# Patient Record
Sex: Female | Born: 1980 | Race: Black or African American | Marital: Married | Smoking: Never smoker
Health system: Northeastern US, Academic
[De-identification: ages and names within clinical notes are randomized; demographics above are authoritative.]

## PROBLEM LIST (undated history)

## (undated) ENCOUNTER — Inpatient Hospital Stay (HOSPITAL_COMMUNITY): Payer: Self-pay

## (undated) DIAGNOSIS — J45909 Unspecified asthma, uncomplicated: Secondary | ICD-10-CM

## (undated) DIAGNOSIS — E669 Obesity, unspecified: Secondary | ICD-10-CM

## (undated) DIAGNOSIS — Z8632 Personal history of gestational diabetes: Secondary | ICD-10-CM

## (undated) HISTORY — DX: Unspecified asthma, uncomplicated: J45.909

## (undated) HISTORY — DX: Obesity, unspecified: E66.9

## (undated) HISTORY — PX: HAND SURGERY: SHX662

## (undated) HISTORY — DX: Personal history of gestational diabetes: Z86.32

---

## 2012-10-14 LAB — CYTOLOGY - PAP: Pap Smear: NEGATIVE

## 2013-09-20 LAB — OB RESULTS CONSOLE HIV ANTIBODY (ROUTINE TESTING): HIV: NONREACTIVE

## 2013-09-20 LAB — OB RESULTS CONSOLE VARICELLA ZOSTER ANTIBODY, IGG: VARICELLA IGG: IMMUNE

## 2013-09-20 LAB — OB RESULTS CONSOLE HGB/HCT, BLOOD
HCT: 33 %
Hemoglobin: 10.1 g/dL

## 2013-09-20 LAB — OB RESULTS CONSOLE RPR: RPR: NONREACTIVE

## 2013-09-20 LAB — OB RESULTS CONSOLE RUBELLA ANTIBODY, IGM: RUBELLA: IMMUNE

## 2013-09-20 LAB — OB RESULTS CONSOLE HEPATITIS B SURFACE ANTIGEN: Hepatitis B Surface Ag: NEGATIVE

## 2013-09-20 LAB — OB RESULTS CONSOLE ABO/RH: RH Type: POSITIVE

## 2013-09-20 LAB — OB RESULTS CONSOLE PLATELET COUNT: PLATELETS: 137 10*3/uL

## 2013-09-20 LAB — GLUCOSE TOLERANCE, 1 HOUR (50G) W/O FASTING: Glucose, 1 Hour GTT: 158

## 2013-09-20 LAB — SICKLE CELL SCREEN: Sickle Cell Screen: NORMAL

## 2013-09-20 LAB — OB RESULTS CONSOLE ANTIBODY SCREEN: ANTIBODY SCREEN: NEGATIVE

## 2013-09-21 ENCOUNTER — Other Ambulatory Visit (HOSPITAL_COMMUNITY): Payer: Self-pay | Admitting: Obstetrics and Gynecology

## 2013-09-21 DIAGNOSIS — O09529 Supervision of elderly multigravida, unspecified trimester: Secondary | ICD-10-CM

## 2013-09-22 LAB — GLUCOSE TOLERANCE, 3 HOURS
GLUCOSE: 141
Glucose, 1 hour: 178
Glucose, Fasting: 109 mg/dL (ref 60–109)
Glucose: 168

## 2013-10-01 ENCOUNTER — Encounter (HOSPITAL_COMMUNITY): Payer: Self-pay

## 2013-10-01 ENCOUNTER — Ambulatory Visit (HOSPITAL_COMMUNITY): Admission: RE | Admit: 2013-10-01 | Payer: Medicaid Other | Source: Ambulatory Visit

## 2013-10-01 ENCOUNTER — Ambulatory Visit (HOSPITAL_COMMUNITY)
Admission: RE | Admit: 2013-10-01 | Discharge: 2013-10-01 | Disposition: A | Discharge status: Home / Self Care | Payer: Medicaid Other | Source: Ambulatory Visit | Attending: Family Medicine | Admitting: Family Medicine

## 2013-10-01 DIAGNOSIS — Z3689 Encounter for other specified antenatal screening: Secondary | ICD-10-CM | POA: Insufficient documentation

## 2013-10-01 DIAGNOSIS — O09529 Supervision of elderly multigravida, unspecified trimester: Secondary | ICD-10-CM | POA: Insufficient documentation

## 2013-10-04 ENCOUNTER — Encounter: Payer: Medicaid Other | Attending: Obstetrics and Gynecology | Admitting: *Deleted

## 2013-10-04 ENCOUNTER — Encounter: Payer: Self-pay | Admitting: Obstetrics and Gynecology

## 2013-10-04 ENCOUNTER — Ambulatory Visit (INDEPENDENT_AMBULATORY_CARE_PROVIDER_SITE_OTHER): Payer: Medicaid Other | Admitting: Obstetrics and Gynecology

## 2013-10-04 VITALS — BP 113/79 | HR 77 | Temp 98.6°F | Ht 64.0 in | Wt 190.8 lb

## 2013-10-04 DIAGNOSIS — O09299 Supervision of pregnancy with other poor reproductive or obstetric history, unspecified trimester: Secondary | ICD-10-CM | POA: Insufficient documentation

## 2013-10-04 DIAGNOSIS — Z713 Dietary counseling and surveillance: Secondary | ICD-10-CM | POA: Diagnosis not present

## 2013-10-04 DIAGNOSIS — O24410 Gestational diabetes mellitus in pregnancy, diet controlled: Secondary | ICD-10-CM

## 2013-10-04 DIAGNOSIS — O99810 Abnormal glucose complicating pregnancy: Secondary | ICD-10-CM | POA: Insufficient documentation

## 2013-10-04 DIAGNOSIS — IMO0002 Reserved for concepts with insufficient information to code with codable children: Secondary | ICD-10-CM

## 2013-10-04 DIAGNOSIS — O0990 Supervision of high risk pregnancy, unspecified, unspecified trimester: Secondary | ICD-10-CM | POA: Insufficient documentation

## 2013-10-04 DIAGNOSIS — O24419 Gestational diabetes mellitus in pregnancy, unspecified control: Secondary | ICD-10-CM | POA: Insufficient documentation

## 2013-10-04 DIAGNOSIS — O0992 Supervision of high risk pregnancy, unspecified, second trimester: Secondary | ICD-10-CM

## 2013-10-04 DIAGNOSIS — Z23 Encounter for immunization: Secondary | ICD-10-CM

## 2013-10-04 LAB — POCT URINALYSIS DIP (DEVICE)
BILIRUBIN URINE: NEGATIVE
Glucose, UA: NEGATIVE mg/dL
Hgb urine dipstick: NEGATIVE
KETONES UR: NEGATIVE mg/dL
NITRITE: NEGATIVE
PH: 5.5 (ref 5.0–8.0)
Protein, ur: NEGATIVE mg/dL
Specific Gravity, Urine: 1.01 (ref 1.005–1.030)
Urobilinogen, UA: 0.2 mg/dL (ref 0.0–1.0)

## 2013-10-04 MED ORDER — GLUCOSE BLOOD VI STRP: ORAL_STRIP | Status: AC

## 2013-10-04 MED ORDER — ACCU-CHEK FASTCLIX LANCETS MISC: 1.0000 | Freq: Four times a day (QID) | Status: AC

## 2013-10-04 NOTE — Progress Notes (Signed)
Initial prenatal visit, Education officer, community # [id number],

## 2013-10-04 NOTE — Progress Notes (Signed)
Pt was seen today for GDM diet education.

## 2013-10-04 NOTE — Progress Notes (Signed)
Pt here due to gestational diabetes. Has not yet received education re: this. Fasting 109 1 hr 178 2 hr 168 3 hr 141 Has had prenatal labs done at [facility], including normal pap (scanned in) Had PN [place] - it's a boy! EDD 01/19/2014 Does have a history of still birth, full term (9 months, delivered at 10 months)  She is worried she will not make it to hospital in time for delivery because she lives in [place] and has no transportation. Will meet with diabetic nurse for teaching and RTC 2 wks with blood sugars Flu today.

## 2013-10-04 NOTE — Progress Notes (Signed)
Pt obese pregravid with wt gain slightly below recommendations today.  No N/V reported.   Pt reports eating 3 meals/day.  Pt taking PNV.  No meds.  Pt given written and verbal education on GDM diet.  Discussed wt gain goal of 11-20# and incorporating physical activity into daily routine.  Pt agrees to follow GDM diet including 3 meals + 3 snacks and proper carbohydrate/protein combination.  Pt already receives [facility].  Pt will be seen for follow-up if referred.  Melanee Left, MPH RD LDN

## 2013-10-04 NOTE — Addendum Note (Signed)
Addended by: Rosendo Gros on: 10/04/2013 09:49 AM   Modules accepted: Orders

## 2013-10-04 NOTE — Progress Notes (Signed)
  Patient was seen on 10/04/13 for Gestational Diabetes self-management . The following learning objectives were met by the patient :   States when to check blood glucose levels  Demonstrates proper blood glucose monitoring techniques  States the effect of stress and exercise on blood glucose levels  States the importance of limiting caffeine and abstaining from alcohol and smoking  Plan:  Consider  increasing your activity level by walking daily as tolerated Begin checking BG before breakfast and 2 hours after first bit of breakfast, lunch and dinner after  as directed by MD   Blood glucose monitor given: Accu Chek Nano BG Monitoring Kit Lot # [id number]  Exp: 08/20/13 Blood glucose reading: $RemoveBeforeDE'106mg'McjMngnHnDGcPmC$ /dl  Patient instructed to monitor glucose levels: FBS: 60 - <90 1 hour: <140 2 hour: <120  Patient received the following handouts:  Nutrition Diabetes and Pregnancy  Carbohydrate Counting List  Meal Planning worksheet  Patient will be seen for follow-up as needed.

## 2013-10-18 ENCOUNTER — Ambulatory Visit (INDEPENDENT_AMBULATORY_CARE_PROVIDER_SITE_OTHER): Payer: Medicaid Other | Admitting: Obstetrics & Gynecology

## 2013-10-18 VITALS — BP 98/75 | HR 79 | Temp 97.9°F | Wt 189.8 lb

## 2013-10-18 DIAGNOSIS — O0942 Supervision of pregnancy with grand multiparity, second trimester: Secondary | ICD-10-CM

## 2013-10-18 DIAGNOSIS — O99810 Abnormal glucose complicating pregnancy: Secondary | ICD-10-CM

## 2013-10-18 DIAGNOSIS — O09292 Supervision of pregnancy with other poor reproductive or obstetric history, second trimester: Secondary | ICD-10-CM

## 2013-10-18 DIAGNOSIS — O24410 Gestational diabetes mellitus in pregnancy, diet controlled: Secondary | ICD-10-CM

## 2013-10-18 DIAGNOSIS — O0940 Supervision of pregnancy with grand multiparity, unspecified trimester: Secondary | ICD-10-CM | POA: Insufficient documentation

## 2013-10-18 DIAGNOSIS — O09299 Supervision of pregnancy with other poor reproductive or obstetric history, unspecified trimester: Secondary | ICD-10-CM

## 2013-10-18 LAB — POCT URINALYSIS DIP (DEVICE)
Bilirubin Urine: NEGATIVE
GLUCOSE, UA: NEGATIVE mg/dL
Ketones, ur: NEGATIVE mg/dL
NITRITE: NEGATIVE
Protein, ur: NEGATIVE mg/dL
Specific Gravity, Urine: 1.015 (ref 1.005–1.030)
UROBILINOGEN UA: 0.2 mg/dL (ref 0.0–1.0)
pH: 7 (ref 5.0–8.0)

## 2013-10-18 MED ORDER — PRENATAL VITAMINS PLUS 27-1 MG PO TABS: 1.0000 | ORAL_TABLET | Freq: Every day | ORAL | Status: DC

## 2013-10-18 MED ORDER — TETANUS-DIPHTH-ACELL PERTUSSIS 5-2.5-18.5 LF-MCG/0.5 IM SUSP: 0.5000 mL | Freq: Once | INTRAMUSCULAR | Status: DC

## 2013-10-18 NOTE — Progress Notes (Signed)
[employer] interpreter [phone] used during this encounter.

## 2013-10-18 NOTE — Progress Notes (Signed)
[employer] interpreter [phone] ([place]i) used for this encounter On review of BS, she has BS on 9/14 to 9/20.  Patient claims these dates were prewritten for her when the book was given to her; but the values correspond to last week. She is also checking her BS five times a day.  She reports checking pre and post breakfast, post lunch, post dinner, and bedtime. Fasting values are within range, have a few elevated postprandials after lunch (3) and dinner (3).  Adherence to diet recommended.   Elevated fundal height noted today.  If persistent by next visit, consider ultrasound Declines tubal sterilization.  Tdap, third trimester labs today. No other complaints or concerns.  Labor and fetal movement precautions reviewed.

## 2013-10-18 NOTE — Patient Instructions (Signed)
Return to clinic for any obstetric concerns or go to MAU for evaluation  

## 2013-10-19 LAB — CBC
HEMATOCRIT: 35 % — AB (ref 36.0–46.0)
Hemoglobin: 11.4 g/dL — ABNORMAL LOW (ref 12.0–15.0)
MCH: 26.6 pg (ref 26.0–34.0)
MCHC: 32.6 g/dL (ref 30.0–36.0)
MCV: 81.6 fL (ref 78.0–100.0)
PLATELETS: 146 10*3/uL — AB (ref 150–400)
RBC: 4.29 MIL/uL (ref 3.87–5.11)
RDW: 23.5 % — AB (ref 11.5–15.5)
WBC: 6.4 10*3/uL (ref 4.0–10.5)

## 2013-10-19 LAB — HEMOGLOBIN A1C
HEMOGLOBIN A1C: 5.7 % — AB (ref <5.7)
Mean Plasma Glucose: 117 mg/dL — ABNORMAL HIGH (ref <117)

## 2013-10-19 LAB — RPR

## 2013-10-19 LAB — HIV ANTIBODY (ROUTINE TESTING W REFLEX): HIV 1&2 Ab, 4th Generation: NONREACTIVE

## 2013-11-01 ENCOUNTER — Encounter: Payer: Self-pay | Admitting: Obstetrics & Gynecology

## 2013-11-01 ENCOUNTER — Encounter: Payer: Medicaid Other | Admitting: Obstetrics & Gynecology

## 2013-11-08 ENCOUNTER — Ambulatory Visit (INDEPENDENT_AMBULATORY_CARE_PROVIDER_SITE_OTHER): Payer: Medicaid Other | Admitting: Obstetrics and Gynecology

## 2013-11-08 VITALS — BP 112/74 | HR 76 | Wt 193.6 lb

## 2013-11-08 DIAGNOSIS — O24410 Gestational diabetes mellitus in pregnancy, diet controlled: Secondary | ICD-10-CM

## 2013-11-08 DIAGNOSIS — O09292 Supervision of pregnancy with other poor reproductive or obstetric history, second trimester: Secondary | ICD-10-CM

## 2013-11-08 DIAGNOSIS — O0993 Supervision of high risk pregnancy, unspecified, third trimester: Secondary | ICD-10-CM

## 2013-11-08 LAB — POCT URINALYSIS DIP (DEVICE)
Bilirubin Urine: NEGATIVE
Glucose, UA: NEGATIVE mg/dL
Ketones, ur: NEGATIVE mg/dL
Nitrite: NEGATIVE
PROTEIN: NEGATIVE mg/dL
Specific Gravity, Urine: 1.01 (ref 1.005–1.030)
UROBILINOGEN UA: 0.2 mg/dL (ref 0.0–1.0)
pH: 6 (ref 5.0–8.0)

## 2013-11-08 NOTE — Progress Notes (Signed)
Language line used today. Doing well today.  1. A1GDM. Well controlled on diet alone. Reviewed blood sugars. Continue diet and exercise.  2. History of IUFD at term. Weekly BPP starting at 28 weeks. Has not had any BPPs yet. Schedule for later this week.

## 2013-11-08 NOTE — Progress Notes (Signed)
BPP with Radiology 11/11/13 @ 315p.

## 2013-11-11 ENCOUNTER — Encounter (HOSPITAL_COMMUNITY): Payer: Self-pay | Admitting: *Deleted

## 2013-11-11 ENCOUNTER — Other Ambulatory Visit: Payer: Self-pay | Admitting: Obstetrics and Gynecology

## 2013-11-11 ENCOUNTER — Ambulatory Visit (HOSPITAL_COMMUNITY)
Admission: RE | Admit: 2013-11-11 | Discharge: 2013-11-11 | Disposition: A | Discharge status: Home / Self Care | Payer: Medicaid Other | Source: Ambulatory Visit | Attending: Obstetrics and Gynecology | Admitting: Obstetrics and Gynecology

## 2013-11-11 ENCOUNTER — Inpatient Hospital Stay (HOSPITAL_COMMUNITY)
Admission: AD | Admit: 2013-11-11 | Discharge: 2013-11-11 | Disposition: A | Discharge status: Home / Self Care | Payer: Medicaid Other | Source: Ambulatory Visit | Attending: Obstetrics & Gynecology | Admitting: Obstetrics & Gynecology

## 2013-11-11 DIAGNOSIS — Z3A30 30 weeks gestation of pregnancy: Secondary | ICD-10-CM | POA: Diagnosis not present

## 2013-11-11 DIAGNOSIS — O24414 Gestational diabetes mellitus in pregnancy, insulin controlled: Secondary | ICD-10-CM | POA: Insufficient documentation

## 2013-11-11 DIAGNOSIS — O403XX0 Polyhydramnios, third trimester, not applicable or unspecified: Secondary | ICD-10-CM | POA: Insufficient documentation

## 2013-11-11 DIAGNOSIS — O283 Abnormal ultrasonic finding on antenatal screening of mother: Secondary | ICD-10-CM | POA: Insufficient documentation

## 2013-11-11 DIAGNOSIS — Z36 Encounter for antenatal screening of mother: Secondary | ICD-10-CM

## 2013-11-11 DIAGNOSIS — O09293 Supervision of pregnancy with other poor reproductive or obstetric history, third trimester: Secondary | ICD-10-CM | POA: Insufficient documentation

## 2013-11-11 DIAGNOSIS — Z3689 Encounter for other specified antenatal screening: Secondary | ICD-10-CM

## 2013-11-11 DIAGNOSIS — O09292 Supervision of pregnancy with other poor reproductive or obstetric history, second trimester: Secondary | ICD-10-CM

## 2013-11-11 DIAGNOSIS — Z3A00 Weeks of gestation of pregnancy not specified: Secondary | ICD-10-CM | POA: Insufficient documentation

## 2013-11-11 DIAGNOSIS — O403XX1 Polyhydramnios, third trimester, fetus 1: Secondary | ICD-10-CM

## 2013-11-11 NOTE — MAU Note (Signed)
Patient was seen in ultrasound today and had a 6/8 BPP. Sent to MAU for monitoring. Patient denies bleeding, leaking or pain and reports fetal movement.

## 2013-11-11 NOTE — OB Triage Note (Signed)
Subjective: Tiffany Poole is a 33 y.o. [id number] at 1738w1d by ultrasound presenting with BPP 6/8 for NST. She had a term IUFD and is undergoing testing, as well as polyhydramnios.   Objective: BP 114/74  Pulse 78  Temp(Src) 98.3 F (36.8 C) (Oral)  Resp 16  LMP 05/05/2013    FHT:  FHR: 135 bpm, variability: moderate,  accelerations:  Present,  decelerations:  Absent            Reactive NST with 15 x 15 accelerations. Labs: Lab Results  Component Value Date   WBC 6.4 10/18/2013   HGB 11.4* 10/18/2013   HCT 35.0* 10/18/2013   MCV 81.6 10/18/2013   PLT 146* 10/18/2013    Assessment / Plan: Discharge to home   ADAMS,SHNIQUAL SHWON 11/11/2013, 7:55 PM  \I have seen and examined this patient and agree the above assessment. CRESENZO-DISHMAN,Coston Mandato 11/11/2013 8:20 PM

## 2013-11-11 NOTE — Discharge Instructions (Signed)

## 2013-11-15 ENCOUNTER — Encounter: Payer: Self-pay | Admitting: *Deleted

## 2013-11-22 ENCOUNTER — Encounter: Payer: Medicaid Other | Admitting: Obstetrics & Gynecology

## 2013-11-23 ENCOUNTER — Encounter (HOSPITAL_COMMUNITY): Payer: Self-pay | Admitting: *Deleted

## 2013-11-29 ENCOUNTER — Ambulatory Visit (INDEPENDENT_AMBULATORY_CARE_PROVIDER_SITE_OTHER): Payer: Medicaid Other | Admitting: Obstetrics & Gynecology

## 2013-11-29 VITALS — BP 108/52 | HR 82 | Temp 98.3°F | Wt 195.8 lb

## 2013-11-29 DIAGNOSIS — O99613 Diseases of the digestive system complicating pregnancy, third trimester: Secondary | ICD-10-CM

## 2013-11-29 DIAGNOSIS — K219 Gastro-esophageal reflux disease without esophagitis: Secondary | ICD-10-CM

## 2013-11-29 DIAGNOSIS — O24410 Gestational diabetes mellitus in pregnancy, diet controlled: Secondary | ICD-10-CM

## 2013-11-29 DIAGNOSIS — O09293 Supervision of pregnancy with other poor reproductive or obstetric history, third trimester: Secondary | ICD-10-CM

## 2013-11-29 LAB — POCT URINALYSIS DIP (DEVICE)
Bilirubin Urine: NEGATIVE
Glucose, UA: NEGATIVE mg/dL
KETONES UR: NEGATIVE mg/dL
Nitrite: NEGATIVE
PH: 7 (ref 5.0–8.0)
PROTEIN: NEGATIVE mg/dL
Specific Gravity, Urine: 1.01 (ref 1.005–1.030)
UROBILINOGEN UA: 0.2 mg/dL (ref 0.0–1.0)

## 2013-11-29 MED ORDER — FAMOTIDINE 20 MG PO TABS: 20.0000 mg | ORAL_TABLET | Freq: Two times a day (BID) | ORAL | Status: AC

## 2013-11-29 NOTE — Patient Instructions (Signed)
Return to clinic for any obstetric concerns or go to MAU for evaluation  

## 2013-11-29 NOTE — Progress Notes (Signed)
BPP and Growth U/S 12/02/13 @ 1245p.  Phone interpretation used.

## 2013-11-29 NOTE — Progress Notes (Signed)
[employer] interpreter used [phone]#[id number]  ([place]i) Blood sugars are within range.  Tums recommended for GERD; Pepcid prescribed Given h/o term IUFD, will start 2x/week testing.  Missed previous appointments for BPP due to transportation issues. Growth scan and BPP ordered on 12/02/13; time changed from 1245 to 0830 (after her scheduled clinic NST at 0730).  Delivery indicated by 39 weeks. No other complaints or concerns.  Labor and strict fetal movement precautions reviewed.

## 2013-11-29 NOTE — Progress Notes (Signed)
C/o of "bad heartburn."

## 2013-12-02 ENCOUNTER — Ambulatory Visit (HOSPITAL_COMMUNITY): Payer: Medicaid Other

## 2013-12-02 ENCOUNTER — Ambulatory Visit (HOSPITAL_COMMUNITY)
Admission: RE | Admit: 2013-12-02 | Discharge: 2013-12-02 | Disposition: A | Discharge status: Home / Self Care | Payer: Medicaid Other | Source: Ambulatory Visit | Attending: Obstetrics & Gynecology | Admitting: Obstetrics & Gynecology

## 2013-12-02 ENCOUNTER — Ambulatory Visit (INDEPENDENT_AMBULATORY_CARE_PROVIDER_SITE_OTHER): Payer: Medicaid Other | Admitting: *Deleted

## 2013-12-02 VITALS — BP 107/74 | HR 78 | Wt 196.5 lb

## 2013-12-02 DIAGNOSIS — Z3A33 33 weeks gestation of pregnancy: Secondary | ICD-10-CM | POA: Insufficient documentation

## 2013-12-02 DIAGNOSIS — O09293 Supervision of pregnancy with other poor reproductive or obstetric history, third trimester: Secondary | ICD-10-CM | POA: Diagnosis not present

## 2013-12-02 DIAGNOSIS — O24410 Gestational diabetes mellitus in pregnancy, diet controlled: Secondary | ICD-10-CM | POA: Insufficient documentation

## 2013-12-02 NOTE — Progress Notes (Signed)
NST reviewed and reactive.  Axie Hayne L. Harraway-Smith, M.D., FACOG    

## 2013-12-02 NOTE — Progress Notes (Signed)
[employer] [phone]

## 2013-12-03 DIAGNOSIS — O0990 Supervision of high risk pregnancy, unspecified, unspecified trimester: Secondary | ICD-10-CM

## 2013-12-06 ENCOUNTER — Ambulatory Visit (INDEPENDENT_AMBULATORY_CARE_PROVIDER_SITE_OTHER): Payer: Medicaid Other | Admitting: *Deleted

## 2013-12-06 VITALS — BP 107/72 | HR 85

## 2013-12-06 DIAGNOSIS — O09293 Supervision of pregnancy with other poor reproductive or obstetric history, third trimester: Secondary | ICD-10-CM

## 2013-12-06 NOTE — Progress Notes (Signed)
[employer] Interpre[employer] # [phone] used for visit today.  Pt advised of test results and follow up appts.

## 2013-12-09 ENCOUNTER — Ambulatory Visit (INDEPENDENT_AMBULATORY_CARE_PROVIDER_SITE_OTHER): Payer: Medicaid Other | Admitting: Family Medicine

## 2013-12-09 VITALS — BP 98/64 | HR 84 | Wt 197.3 lb

## 2013-12-09 DIAGNOSIS — O24410 Gestational diabetes mellitus in pregnancy, diet controlled: Secondary | ICD-10-CM

## 2013-12-09 DIAGNOSIS — O09293 Supervision of pregnancy with other poor reproductive or obstetric history, third trimester: Secondary | ICD-10-CM | POA: Diagnosis not present

## 2013-12-09 LAB — POCT URINALYSIS DIP (DEVICE)
Bilirubin Urine: NEGATIVE
Glucose, UA: NEGATIVE mg/dL
Ketones, ur: NEGATIVE mg/dL
Nitrite: NEGATIVE
Protein, ur: NEGATIVE mg/dL
Specific Gravity, Urine: 1.015 (ref 1.005–1.030)
UROBILINOGEN UA: 0.2 mg/dL (ref 0.0–1.0)
pH: 6.5 (ref 5.0–8.0)

## 2013-12-09 LAB — US OB FOLLOW UP

## 2013-12-09 NOTE — Progress Notes (Signed)
[employer] interpreter# [id number] FBS 75-90 2 hr pp 110-120 No book--this is her report. U/s shows 71% with mild polyhydramnios NST reviewed and reactive.

## 2013-12-09 NOTE — Patient Instructions (Signed)
Gestational Diabetes Mellitus Gestational diabetes mellitus, often simply referred to as gestational diabetes, is a type of diabetes that some women develop during pregnancy. In gestational diabetes, the pancreas does not make enough insulin (a hormone), the cells are less responsive to the insulin that is made (insulin resistance), or both.Normally, insulin moves sugars from food into the tissue cells. The tissue cells use the sugars for energy. The lack of insulin or the lack of normal response to insulin causes excess sugars to build up in the blood instead of going into the tissue cells. As a result, high blood sugar (hyperglycemia) develops. The effect of high sugar (glucose) levels can cause many problems.  RISK FACTORS You have an increased chance of developing gestational diabetes if you have a family history of diabetes and also have one or more of the following risk factors:  A body mass index over 30 (obesity).  A previous pregnancy with gestational diabetes.  An older age at the time of pregnancy. If blood glucose levels are kept in the normal range during pregnancy, women can have a healthy pregnancy. If your blood glucose levels are not well controlled, there may be risks to you, your unborn baby (fetus), your labor and delivery, or your newborn baby.  SYMPTOMS  If symptoms are experienced, they are much like symptoms you would normally expect during pregnancy. The symptoms of gestational diabetes include:   Increased thirst (polydipsia).  Increased urination (polyuria).  Increased urination during the night (nocturia).  Weight loss. This weight loss may be rapid.  Frequent, recurring infections.  Tiredness (fatigue).  Weakness.  Vision changes, such as blurred vision.  Fruity smell to your breath.  Abdominal pain. DIAGNOSIS Diabetes is diagnosed when blood glucose levels are increased. Your blood glucose level may be checked by one or more of the following blood  tests:  A fasting blood glucose test. You will not be allowed to eat for at least 8 hours before a blood sample is taken.  A random blood glucose test. Your blood glucose is checked at any time of the day regardless of when you ate.  A hemoglobin A1c blood glucose test. A hemoglobin A1c test provides information about blood glucose control over the previous 3 months.  An oral glucose tolerance test (OGTT). Your blood glucose is measured after you have not eaten (fasted) for 1-3 hours and then after you drink a glucose-containing beverage. Since the hormones that cause insulin resistance are highest at about 24-28 weeks of a pregnancy, an OGTT is usually performed during that time. If you have risk factors for gestational diabetes, your health care provider may test you for gestational diabetes earlier than 24 weeks of pregnancy. TREATMENT   You will need to take diabetes medicine or insulin daily to keep blood glucose levels in the desired range.  You will need to match insulin dosing with exercise and healthy food choices. The treatment goal is to maintain the before-meal (preprandial), bedtime, and overnight blood glucose level at 60-99 mg/dL during pregnancy. The treatment goal is to further maintain peak after-meal blood sugar (postprandial glucose) level at 100-140 mg/dL. HOME CARE INSTRUCTIONS   Have your hemoglobin A1c level checked twice a year.  Perform daily blood glucose monitoring as directed by your health care provider. It is common to perform frequent blood glucose monitoring.  Monitor urine ketones when you are ill and as directed by your health care provider.  Take your diabetes medicine and insulin as directed by your health care provider   to maintain your blood glucose level in the desired range.  Never run out of diabetes medicine or insulin. It is needed every day.  Adjust insulin based on your intake of carbohydrates. Carbohydrates can raise blood glucose levels but  need to be included in your diet. Carbohydrates provide vitamins, minerals, and fiber which are an essential part of a healthy diet. Carbohydrates are found in fruits, vegetables, whole grains, dairy products, legumes, and foods containing added sugars.  Eat healthy foods. Alternate 3 meals with 3 snacks.  Maintain a healthy weight gain. The usual total expected weight gain varies according to your prepregnancy body mass index (BMI).  Carry a medical alert card or wear your medical alert jewelry.  Carry a 15-gram carbohydrate snack with you at all times to treat low blood glucose (hypoglycemia). Some examples of 15-gram carbohydrate snacks include:  Glucose tablets, 3 or 4.  Glucose gel, 15-gram tube.  Raisins, 2 tablespoons (24 g).  Jelly beans, 6.  Animal crackers, 8.  Fruit juice, regular soda, or low-fat milk, 4 ounces (120 mL).  Gummy treats, 9.  Recognize hypoglycemia. Hypoglycemia during pregnancy occurs with blood glucose levels of 60 mg/dL and below. The risk for hypoglycemia increases when fasting or skipping meals, during or after intense exercise, and during sleep. Hypoglycemia symptoms can include:  Tremors or shakes.  Decreased ability to concentrate.  Sweating.  Increased heart rate.  Headache.  Dry mouth.  Hunger.  Irritability.  Anxiety.  Restless sleep.  Altered speech or coordination.  Confusion.  Treat hypoglycemia promptly. If you are alert and able to safely swallow, follow the 15:15 rule:  Take 15-20 grams of rapid-acting glucose or carbohydrate. Rapid-acting options include glucose gel, glucose tablets, or 4 ounces (120 mL) of fruit juice, regular soda, or low-fat milk.  Check your blood glucose level 15 minutes after taking the glucose.  Take 15-20 grams more of glucose if the repeat blood glucose level is still 70 mg/dL or below.  Eat a meal or snack within 1 hour once blood glucose levels return to normal.  Be alert to polyuria  (excess urination) and polydipsia (excess thirst) which are early signs of hyperglycemia. An early awareness of hyperglycemia allows for prompt treatment. Treat hyperglycemia as directed by your health care provider.  Engage in at least 30 minutes of physical activity a day or as directed by your health care provider. Ten minutes of physical activity timed 30 minutes after each meal is encouraged to control postprandial blood glucose levels.  Adjust your insulin dosing and food intake as needed if you start a new exercise or sport.  Follow your sick-day plan at any time you are unable to eat or drink as usual.  Avoid tobacco and alcohol use.  Keep all follow-up visits as directed by your health care provider.  Follow the advice of your health care provider regarding your prenatal and post-delivery (postpartum) appointments, meal planning, exercise, medicines, vitamins, blood tests, other medical tests, and physical activities.  Perform daily skin and foot care. Examine your skin and feet daily for cuts, bruises, redness, nail problems, bleeding, blisters, or sores.  Brush your teeth and gums at least twice a day and floss at least once a day. Follow up with your dentist regularly.  Schedule an eye exam during the first trimester of your pregnancy or as directed by your health care provider.  Share your diabetes management plan with your workplace or school.  Stay up-to-date with immunizations.  Learn to manage stress.    Obtain ongoing diabetes education and support as needed.  Learn about and consider breastfeeding your baby.  You should have your blood sugar level checked 6-12 weeks after delivery. This is done with an oral glucose tolerance test (OGTT). SEEK MEDICAL CARE IF:   You are unable to eat food or drink fluids for more than 6 hours.  You have nausea and vomiting for more than 6 hours.  You have a blood glucose level of 200 mg/dL and you have ketones in your  urine.  There is a change in mental status.  You develop vision problems.  You have a persistent headache.  You have upper abdominal pain or discomfort.  You develop an additional serious illness.  You have diarrhea for more than 6 hours.  You have been sick or have had a fever for a couple of days and are not getting better. SEEK IMMEDIATE MEDICAL CARE IF:   You have difficulty breathing.  You no longer feel the baby moving.  You are bleeding or have discharge from your vagina.  You start having premature contractions or labor. MAKE SURE YOU:  Understand these instructions.  Will watch your condition.  Will get help right away if you are not doing well or get worse. Document Released: 04/15/2000 Document Revised: 05/24/2013 Document Reviewed: 08/06/2011 Exit[employer] Patient Information 2015 [employer]. This information is not intended to replace advice given to you by your health care provider. Make sure you discuss any questions you have with your health care provider.  Breastfeeding Deciding to breastfeed is one of the best choices you can make for you and your baby. A change in hormones during pregnancy causes your breast tissue to grow and increases the number and size of your milk ducts. These hormones also allow proteins, sugars, and fats from your blood supply to make breast milk in your milk-producing glands. Hormones prevent breast milk from being released before your baby is born as well as prompt milk flow after birth. Once breastfeeding has begun, thoughts of your baby, as well as his or her sucking or crying, can stimulate the release of milk from your milk-producing glands.  BENEFITS OF BREASTFEEDING For Your Baby  Your first milk (colostrum) helps your baby's digestive system function better.   There are antibodies in your milk that help your baby fight off infections.   Your baby has a lower incidence of asthma, allergies, and sudden infant death  syndrome.   The nutrients in breast milk are better for your baby than infant formulas and are designed uniquely for your baby's needs.   Breast milk improves your baby's brain development.   Your baby is less likely to develop other conditions, such as childhood obesity, asthma, or type 2 diabetes mellitus.  For You   Breastfeeding helps to create a very special bond between you and your baby.   Breastfeeding is convenient. Breast milk is always available at the correct temperature and costs nothing.   Breastfeeding helps to burn calories and helps you lose the weight gained during pregnancy.   Breastfeeding makes your uterus contract to its prepregnancy size faster and slows bleeding (lochia) after you give birth.   Breastfeeding helps to lower your risk of developing type 2 diabetes mellitus, osteoporosis, and breast or ovarian cancer later in life. SIGNS THAT YOUR BABY IS HUNGRY Early Signs of Hunger  Increased alertness or activity.  Stretching.  Movement of the head from side to side.  Movement of the head and opening of the   mouth when the corner of the mouth or cheek is stroked (rooting).  Increased sucking sounds, smacking lips, cooing, sighing, or squeaking.  Hand-to-mouth movements.  Increased sucking of fingers or hands. Late Signs of Hunger  Fussing.  Intermittent crying. Extreme Signs of Hunger Signs of extreme hunger will require calming and consoling before your baby will be able to breastfeed successfully. Do not wait for the following signs of extreme hunger to occur before you initiate breastfeeding:   Restlessness.  A loud, strong cry.   Screaming. BREASTFEEDING BASICS Breastfeeding Initiation  Find a comfortable place to sit or lie down, with your neck and back well supported.  Place a pillow or rolled up blanket under your baby to bring him or her to the level of your breast (if you are seated). Nursing pillows are specially designed  to help support your arms and your baby while you breastfeed.  Make sure that your baby's abdomen is facing your abdomen.   Gently massage your breast. With your fingertips, massage from your chest wall toward your nipple in a circular motion. This encourages milk flow. You may need to continue this action during the feeding if your milk flows slowly.  Support your breast with 4 fingers underneath and your thumb above your nipple. Make sure your fingers are well away from your nipple and your baby's mouth.   Stroke your baby's lips gently with your finger or nipple.   When your baby's mouth is open wide enough, quickly bring your baby to your breast, placing your entire nipple and as much of the colored area around your nipple (areola) as possible into your baby's mouth.   More areola should be visible above your baby's upper lip than below the lower lip.   Your baby's tongue should be between his or her lower gum and your breast.   Ensure that your baby's mouth is correctly positioned around your nipple (latched). Your baby's lips should create a seal on your breast and be turned out (everted).  It is common for your baby to suck about 2-3 minutes in order to start the flow of breast milk. Latching Teaching your baby how to latch on to your breast properly is very important. An improper latch can cause nipple pain and decreased milk supply for you and poor weight gain in your baby. Also, if your baby is not latched onto your nipple properly, he or she may swallow some air during feeding. This can make your baby fussy. Burping your baby when you switch breasts during the feeding can help to get rid of the air. However, teaching your baby to latch on properly is still the best way to prevent fussiness from swallowing air while breastfeeding. Signs that your baby has successfully latched on to your nipple:    Silent tugging or silent sucking, without causing you pain.   Swallowing  heard between every 3-4 sucks.    Muscle movement above and in front of his or her ears while sucking.  Signs that your baby has not successfully latched on to nipple:   Sucking sounds or smacking sounds from your baby while breastfeeding.  Nipple pain. If you think your baby has not latched on correctly, slip your finger into the corner of your baby's mouth to break the suction and place it between your baby's gums. Attempt breastfeeding initiation again. Signs of Successful Breastfeeding Signs from your baby:   A gradual decrease in the number of sucks or complete cessation of sucking.     Falling asleep.   Relaxation of his or her body.   Retention of a small amount of milk in his or her mouth.   Letting go of your breast by himself or herself. Signs from you:  Breasts that have increased in firmness, weight, and size 1-3 hours after feeding.   Breasts that are softer immediately after breastfeeding.  Increased milk volume, as well as a change in milk consistency and color by the fifth day of breastfeeding.   Nipples that are not sore, cracked, or bleeding. Signs That Your Baby is Getting Enough Milk  Wetting at least 3 diapers in a 24-hour period. The urine should be clear and pale yellow by age 5 days.  At least 3 stools in a 24-hour period by age 5 days. The stool should be soft and yellow.  At least 3 stools in a 24-hour period by age 7 days. The stool should be seedy and yellow.  No loss of weight greater than 10% of birth weight during the first 3 days of age.  Average weight gain of 4-7 ounces (113-198 g) per week after age 4 days.  Consistent daily weight gain by age 5 days, without weight loss after the age of 2 weeks. After a feeding, your baby may spit up a small amount. This is common. BREASTFEEDING FREQUENCY AND DURATION Frequent feeding will help you make more milk and can prevent sore nipples and breast engorgement. Breastfeed when you feel the  need to reduce the fullness of your breasts or when your baby shows signs of hunger. This is called "breastfeeding on demand." Avoid introducing a pacifier to your baby while you are working to establish breastfeeding (the first 4-6 weeks after your baby is born). After this time you may choose to use a pacifier. Research has shown that pacifier use during the first year of a baby's life decreases the risk of sudden infant death syndrome (SIDS). Allow your baby to feed on each breast as long as he or she wants. Breastfeed until your baby is finished feeding. When your baby unlatches or falls asleep while feeding from the first breast, offer the second breast. Because newborns are often sleepy in the first few weeks of life, you may need to awaken your baby to get him or her to feed. Breastfeeding times will vary from baby to baby. However, the following rules can serve as a guide to help you ensure that your baby is properly fed:  Newborns (babies 4 weeks of age or younger) may breastfeed every 1-3 hours.  Newborns should not go longer than 3 hours during the day or 5 hours during the night without breastfeeding.  You should breastfeed your baby a minimum of 8 times in a 24-hour period until you begin to introduce solid foods to your baby at around 6 months of age. BREAST MILK PUMPING Pumping and storing breast milk allows you to ensure that your baby is exclusively fed your breast milk, even at times when you are unable to breastfeed. This is especially important if you are going back to work while you are still breastfeeding or when you are not able to be present during feedings. Your lactation consultant can give you guidelines on how long it is safe to store breast milk.  A breast pump is a machine that allows you to pump milk from your breast into a sterile bottle. The pumped breast milk can then be stored in a refrigerator or freezer. Some breast pumps are operated by   hand, while others use  electricity. Ask your lactation consultant which type will work best for you. Breast pumps can be purchased, but some hospitals and breastfeeding support groups lease breast pumps on a monthly basis. A lactation consultant can teach you how to hand express breast milk, if you prefer not to use a pump.  CARING FOR YOUR BREASTS WHILE YOU BREASTFEED Nipples can become dry, cracked, and sore while breastfeeding. The following recommendations can help keep your breasts moisturized and healthy:  Avoid using soap on your nipples.   Wear a supportive bra. Although not required, special nursing bras and tank tops are designed to allow access to your breasts for breastfeeding without taking off your entire bra or top. Avoid wearing underwire-style bras or extremely tight bras.  Air dry your nipples for 3-4minutes after each feeding.   Use only cotton bra pads to absorb leaked breast milk. Leaking of breast milk between feedings is normal.   Use lanolin on your nipples after breastfeeding. Lanolin helps to maintain your skin's normal moisture barrier. If you use pure lanolin, you do not need to wash it off before feeding your baby again. Pure lanolin is not toxic to your baby. You may also hand express a few drops of breast milk and gently massage that milk into your nipples and allow the milk to air dry. In the first few weeks after giving birth, some women experience extremely full breasts (engorgement). Engorgement can make your breasts feel heavy, warm, and tender to the touch. Engorgement peaks within 3-5 days after you give birth. The following recommendations can help ease engorgement:  Completely empty your breasts while breastfeeding or pumping. You may want to start by applying warm, moist heat (in the shower or with warm water-soaked hand towels) just before feeding or pumping. This increases circulation and helps the milk flow. If your baby does not completely empty your breasts while  breastfeeding, pump any extra milk after he or she is finished.  Wear a snug bra (nursing or regular) or tank top for 1-2 days to signal your body to slightly decrease milk production.  Apply ice packs to your breasts, unless this is too uncomfortable for you.  Make sure that your baby is latched on and positioned properly while breastfeeding. If engorgement persists after 48 hours of following these recommendations, contact your health care provider or a lactation consultant. OVERALL HEALTH CARE RECOMMENDATIONS WHILE BREASTFEEDING  Eat healthy foods. Alternate between meals and snacks, eating 3 of each per day. Because what you eat affects your breast milk, some of the foods may make your baby more irritable than usual. Avoid eating these foods if you are sure that they are negatively affecting your baby.  Drink milk, fruit juice, and water to satisfy your thirst (about 10 glasses a day).   Rest often, relax, and continue to take your prenatal vitamins to prevent fatigue, stress, and anemia.  Continue breast self-awareness checks.  Avoid chewing and smoking tobacco.  Avoid alcohol and drug use. Some medicines that may be harmful to your baby can pass through breast milk. It is important to ask your health care provider before taking any medicine, including all over-the-counter and prescription medicine as well as vitamin and herbal supplements. It is possible to become pregnant while breastfeeding. If birth control is desired, ask your health care provider about options that will be safe for your baby. SEEK MEDICAL CARE IF:   You feel like you want to stop breastfeeding or have become   frustrated with breastfeeding.  You have painful breasts or nipples.  Your nipples are cracked or bleeding.  Your breasts are red, tender, or warm.  You have a swollen area on either breast.  You have a fever or chills.  You have nausea or vomiting.  You have drainage other than breast milk from  your nipples.  Your breasts do not become full before feedings by the fifth day after you give birth.  You feel sad and depressed.  Your baby is too sleepy to eat well.  Your baby is having trouble sleeping.   Your baby is wetting less than 3 diapers in a 24-hour period.  Your baby has less than 3 stools in a 24-hour period.  Your baby's skin or the white part of his or her eyes becomes yellow.   Your baby is not gaining weight by 5 days of age. SEEK IMMEDIATE MEDICAL CARE IF:   Your baby is overly tired (lethargic) and does not want to wake up and feed.  Your baby develops an unexplained fever. Document Released: 01/07/2005 Document Revised: 01/12/2013 Document Reviewed: 07/01/2012 Exit[employer] Patient Information 2015 [employer]. This information is not intended to replace advice given to you by your health care provider. Make sure you discuss any questions you have with your health care provider.  

## 2013-12-09 NOTE — Progress Notes (Signed)
Used [employer] [phone]#[id number].

## 2013-12-13 ENCOUNTER — Ambulatory Visit (INDEPENDENT_AMBULATORY_CARE_PROVIDER_SITE_OTHER): Payer: Medicaid Other | Admitting: *Deleted

## 2013-12-13 ENCOUNTER — Ambulatory Visit (HOSPITAL_COMMUNITY)
Admission: RE | Admit: 2013-12-13 | Discharge: 2013-12-13 | Disposition: A | Discharge status: Home / Self Care | Payer: Medicaid Other | Source: Ambulatory Visit | Attending: Obstetrics and Gynecology | Admitting: Obstetrics and Gynecology

## 2013-12-13 VITALS — BP 111/64 | HR 81

## 2013-12-13 DIAGNOSIS — O09523 Supervision of elderly multigravida, third trimester: Secondary | ICD-10-CM | POA: Insufficient documentation

## 2013-12-13 DIAGNOSIS — O09293 Supervision of pregnancy with other poor reproductive or obstetric history, third trimester: Secondary | ICD-10-CM | POA: Insufficient documentation

## 2013-12-13 DIAGNOSIS — O403XX0 Polyhydramnios, third trimester, not applicable or unspecified: Secondary | ICD-10-CM | POA: Diagnosis not present

## 2013-12-13 DIAGNOSIS — O24410 Gestational diabetes mellitus in pregnancy, diet controlled: Secondary | ICD-10-CM | POA: Diagnosis not present

## 2013-12-13 DIAGNOSIS — Z3A34 34 weeks gestation of pregnancy: Secondary | ICD-10-CM | POA: Diagnosis not present

## 2013-12-13 DIAGNOSIS — O24419 Gestational diabetes mellitus in pregnancy, unspecified control: Secondary | ICD-10-CM

## 2013-12-13 NOTE — Progress Notes (Signed)
Category 1 tracing with baseline in 130s.  Moderate variability, multiple accelerations, no decelerations.  

## 2013-12-13 NOTE — Progress Notes (Signed)
BPP today

## 2013-12-13 NOTE — Progress Notes (Signed)
11/16 NST reviewed and reactive 

## 2013-12-20 ENCOUNTER — Ambulatory Visit (INDEPENDENT_AMBULATORY_CARE_PROVIDER_SITE_OTHER): Payer: Medicaid Other | Admitting: *Deleted

## 2013-12-20 VITALS — BP 112/73 | HR 85

## 2013-12-20 DIAGNOSIS — O09293 Supervision of pregnancy with other poor reproductive or obstetric history, third trimester: Secondary | ICD-10-CM

## 2013-12-20 DIAGNOSIS — O403XX1 Polyhydramnios, third trimester, fetus 1: Secondary | ICD-10-CM

## 2013-12-20 NOTE — Progress Notes (Signed)
NST reviewed and reactive.  Rada Zegers L. Harraway-Smith, M.D., FACOG    

## 2013-12-23 ENCOUNTER — Encounter: Payer: Self-pay | Admitting: Obstetrics and Gynecology

## 2013-12-23 ENCOUNTER — Other Ambulatory Visit: Payer: Self-pay | Admitting: Obstetrics and Gynecology

## 2013-12-23 ENCOUNTER — Ambulatory Visit (INDEPENDENT_AMBULATORY_CARE_PROVIDER_SITE_OTHER): Payer: Medicaid Other | Admitting: Obstetrics and Gynecology

## 2013-12-23 ENCOUNTER — Encounter: Payer: Self-pay | Admitting: *Deleted

## 2013-12-23 VITALS — BP 106/69 | HR 88 | Wt 196.4 lb

## 2013-12-23 DIAGNOSIS — O24419 Gestational diabetes mellitus in pregnancy, unspecified control: Secondary | ICD-10-CM

## 2013-12-23 DIAGNOSIS — O09293 Supervision of pregnancy with other poor reproductive or obstetric history, third trimester: Secondary | ICD-10-CM

## 2013-12-23 DIAGNOSIS — O0993 Supervision of high risk pregnancy, unspecified, third trimester: Secondary | ICD-10-CM

## 2013-12-23 DIAGNOSIS — O0943 Supervision of pregnancy with grand multiparity, third trimester: Secondary | ICD-10-CM

## 2013-12-23 LAB — POCT URINALYSIS DIP (DEVICE)
BILIRUBIN URINE: NEGATIVE
Glucose, UA: NEGATIVE mg/dL
Ketones, ur: NEGATIVE mg/dL
NITRITE: NEGATIVE
PROTEIN: NEGATIVE mg/dL
Specific Gravity, Urine: 1.01 (ref 1.005–1.030)
Urobilinogen, UA: 0.2 mg/dL (ref 0.0–1.0)
pH: 6.5 (ref 5.0–8.0)

## 2013-12-23 LAB — FETAL NONSTRESS TEST

## 2013-12-23 LAB — OB RESULTS CONSOLE GC/CHLAMYDIA
CHLAMYDIA, DNA PROBE: NEGATIVE
Gonorrhea: NEGATIVE

## 2013-12-23 LAB — US OB FOLLOW UP

## 2013-12-23 LAB — OB RESULTS CONSOLE GBS: GBS: NEGATIVE

## 2013-12-23 NOTE — Progress Notes (Signed)
Growth ultrasound scheduled for 12/17 @ 8:45. Used [employer] [phone]#[id number]

## 2013-12-23 NOTE — Addendum Note (Signed)
Addended by: Jill SideAY, Jary Louvier L on: 12/23/2013 10:18 AM   Modules accepted: Level of Service

## 2013-12-23 NOTE — Progress Notes (Signed)
Patient is doing well. CBGs reviewed and all within range. Cultures collected today NST reviewed and reactive

## 2013-12-23 NOTE — Addendum Note (Signed)
Addended by: Candelaria StagersHAIZLIP, Davidson Palmieri E on: 12/23/2013 02:55 PM   Modules accepted: Orders

## 2013-12-24 LAB — GC/CHLAMYDIA PROBE AMP
CT Probe RNA: NEGATIVE
GC PROBE AMP APTIMA: NEGATIVE

## 2013-12-25 LAB — CULTURE, BETA STREP (GROUP B ONLY)

## 2013-12-27 ENCOUNTER — Ambulatory Visit (INDEPENDENT_AMBULATORY_CARE_PROVIDER_SITE_OTHER): Payer: Medicaid Other | Admitting: *Deleted

## 2013-12-27 ENCOUNTER — Encounter: Payer: Self-pay | Admitting: *Deleted

## 2013-12-27 VITALS — BP 108/69 | HR 74

## 2013-12-27 DIAGNOSIS — O24419 Gestational diabetes mellitus in pregnancy, unspecified control: Secondary | ICD-10-CM

## 2013-12-27 DIAGNOSIS — O09293 Supervision of pregnancy with other poor reproductive or obstetric history, third trimester: Secondary | ICD-10-CM

## 2013-12-27 DIAGNOSIS — O403XX1 Polyhydramnios, third trimester, fetus 1: Secondary | ICD-10-CM

## 2013-12-27 NOTE — Progress Notes (Signed)
Reactive NST 

## 2013-12-27 NOTE — Progress Notes (Signed)
[employer] # [phone] used for visit.  Pt informed of test results.  She voiced understanding and stated that she has a conflict with her clinic appt on 12/10. Her child has a dental appt in [place] at the same time. With the pt's permission, I attempted to change the child's dental appt but was unable to do so. Instead, her clinic appt was changed to 12/11 @ 0900. I advised pt that I will call Bennie HindCindy Simpson - [facility] and ask her to arrange transportation for the new appt. If transportation is not available, someone will call [place] to inform her.  Pt voiced understanding.

## 2013-12-28 ENCOUNTER — Telehealth: Payer: Self-pay | Admitting: *Deleted

## 2013-12-28 NOTE — Telephone Encounter (Signed)
Called pt with [employer] # [id number].  I informed her that transportation has been arranged for her appt on 12/11. She will need to be ready for pick up between 0710-0750.  Pt voiced understanding.

## 2013-12-30 ENCOUNTER — Other Ambulatory Visit: Payer: Medicaid Other

## 2013-12-31 ENCOUNTER — Encounter: Payer: Self-pay | Admitting: *Deleted

## 2013-12-31 ENCOUNTER — Ambulatory Visit (INDEPENDENT_AMBULATORY_CARE_PROVIDER_SITE_OTHER): Payer: Medicaid Other | Admitting: Family Medicine

## 2013-12-31 VITALS — BP 106/70 | HR 92 | Temp 98.6°F | Wt 199.6 lb

## 2013-12-31 DIAGNOSIS — O09293 Supervision of pregnancy with other poor reproductive or obstetric history, third trimester: Secondary | ICD-10-CM

## 2013-12-31 DIAGNOSIS — Z789 Other specified health status: Secondary | ICD-10-CM

## 2013-12-31 DIAGNOSIS — O09299 Supervision of pregnancy with other poor reproductive or obstetric history, unspecified trimester: Secondary | ICD-10-CM

## 2013-12-31 DIAGNOSIS — O403XX1 Polyhydramnios, third trimester, fetus 1: Secondary | ICD-10-CM | POA: Diagnosis not present

## 2013-12-31 DIAGNOSIS — O409XX0 Polyhydramnios, unspecified trimester, not applicable or unspecified: Secondary | ICD-10-CM

## 2013-12-31 DIAGNOSIS — O24419 Gestational diabetes mellitus in pregnancy, unspecified control: Secondary | ICD-10-CM | POA: Diagnosis present

## 2013-12-31 LAB — POCT URINALYSIS DIP (DEVICE)
Bilirubin Urine: NEGATIVE
GLUCOSE, UA: NEGATIVE mg/dL
HGB URINE DIPSTICK: NEGATIVE
Ketones, ur: NEGATIVE mg/dL
Nitrite: NEGATIVE
Protein, ur: NEGATIVE mg/dL
Specific Gravity, Urine: 1.015 (ref 1.005–1.030)
UROBILINOGEN UA: 0.2 mg/dL (ref 0.0–1.0)
pH: 7 (ref 5.0–8.0)

## 2013-12-31 LAB — US OB FOLLOW UP

## 2013-12-31 NOTE — Patient Instructions (Addendum)
[place] Shinka Mellitus [place] Shinka Mellitus, [place] badan si fudud loo yaqaan [place], waa nooc ka mid ah [place] in [place] qaar ka mid ah horumarinta Miles Costain aad Alaf[place]. [place] ilmuhu [place], beeryarada ma sameyso insulin ku filan (hormone), unugyada yar ka jawaabaan insulin ah in la sameeyo (u [place] insuliinta), ama labadaba. Sida caadiga ah, insulin guuro sonkorta cuntada unugyada nudaha. Unugyada unugyada isticmaali sonkorta ee tamarta. La'aanta insulin ama jawaab la'aan caadi ah in insulin sababa sonkorta xad-dhaaf ah si ay u dhisaan ilaa dhiigga halkii ugu galaan unugyada nudaha. Sidaas darteed, sonkorta dhiigga ee sareeya (hyperglycemia) uu yeesho. Saamaynta ay heerka sonkorta badan (glucose) waxay keeni kartaa dhibaatooyin badan. Arrimaha Halista leh Waxaad fursad dheeraad ah ee soo koraya [place] ilmuhu uurka haddii aad leedahay taariikh qoys ee cudurka macaanka ah oo kale oo ay leeyihiin mid ama ka badan ee soo socda arrimaha halista: A mass index jirka ka badan 30 (cayilka). [place] hore oo qaba [place] uurka. Da'da Katharine Look [place]. Haddii heerka sonkorta dhiigga waxaa lagu hayaa ee heerka caadiga ah inta lagu guda jiro [place], haween yeelan karaan uur caafimaad leh. Haddii heerka sonkorta dhiigga aan si fiican loo xakameeyo, waxaa laga yaabaa in [place] in [place], [place] ku jira (ilmaha caloosha), aad foosha iyo [place], ama ilmahaaga hadda dhashay. ASTAAMAHA LAGU GARTO Haddii calaamadaha [place], ay u badan yihiin sida calaamadaha aad caadi ahaan ka filan lahayd [place]. Calaamadaha cudurka Salley Slaughter uurka ka mid ah: Harraad ugu kordhiyo (polydipsia). Kaadida oo kordhay (polyuria). Kaadida oo la kordhiyo habeenkii ([place]). Caatoobid. Miisaanka oo yaraada Tani waxay noqon kartaa degdeg ah. Soo noqnoqda, cudurada soo noqnoqda. Daal (daal). Tabar. Isbedel Vision, sida aragti ceryaamo. Urta Maalmihii in aad  neefta. Calool xanuun. CUDURKA Diabetes la ogaado marka heerka sonkorta dhiigga waa sii korodhay. Heerka sonkorta dhiigga waa la baari kara mid ama ka badan oo ka mid ah baaritaan dhiig soo socda: Baaritaan sonkorta dhiigga soonka. Looma ogolaan doono inaad u cunin ugu yaraan 8 saacadood ka hor tijaabada dhiigga la qaaday. A imtixaanka sonkorta dhiigga random. Glucose Dhiig ayaa lagaa baaro mar kasta maalinta loo eegin marka aad cunay. A imtixaanka sonkorta dhiigga A1c hemoglobin. Imtixaanka A1c hemoglobin A waxay bixisaa macluumaad ku saabsan sonkorta dhiigga gacanta hore 3 bilood. Baarista dulqaadka sonkorta afka ah (OGTT). Sonkorta dhiigaaga ayaa la cabbiraa ka dib markii aad wax cuntay (soomay) ee 1-3 saacadood, ka dibna ka dib markii aad cabto cabitaan glucose-ay ku jiraan a. Tan iyo hormoonnada keena iska caabin insulin waa ugu badan ee ku saabsan 24-28 toddobaad ee [place] ah, OGTT ah waxaa [place] badan lagu sameeyaa inta lagu guda jiro wakhtigaas. Haddii aad qabto sababaha halista kaadi [place] uurka, bixiyaha xanaanada caafimaadkaaga wuxuu idinku imtixaami laga yaabaa in [place] hore ka badan 24 toddobaad ee uurka. DAAWEYNTA Waxaad u baahan doontaa in aad qaadato daawo sonkorow ama insulin maalin kasta si ay u dhawrto heerka sonkorta dhiigga ee kala duwan ee la doonayo. Waxaad u baahan doontaa in ay isu barbar kacmaha insulin la jimicsi iyo cunto doorasho caafimaad leh. Hadafka daweynta waa in la sii wado ka hor-cuntada (preprandial), [place], iyo heerka habeen sonkorta dhiigga at 60-99 mg / dL [place] ZOXWRUEA. Hadafka daweynta waa in la sii joogteeyo peak ka dib-cuntada sonkorta dhiigga (glucose postprandial) heerka ugu 100-140 mg / dL. CARE HOME TILMAAMAHA U sheeg heerka hemoglobin A1c labo jeer hubiyaa sano ah. Samee maalin kasta qiyaasta sonkorta dhiigga sida uu bixiyaha xanaanada caafimaadkaaga. Waa caadi in ay sameeyaan soo noqnoqda qiyaasta sonkorta dhiigga. La  soco ketones kaadida marka aad jiran iyo sida uu bixiyaha xanaanada caafimaadkaaga yihiin. Daawadaada sonkorowga u qaado iyo insulin sida uu kuu sheego bixiyaha daryeelka caafimaadka si ay u ilaaliyaan heerka sonkorta dhiigga ee kala duwan ee la doonayo. Marna ha ka soo dhamaadeen daawo sonkorow ama insulin. Waxaa loo baahan yahay Kelloggmaalin kasta. Dheellitir insulin ku salaysan qaadashada ee carbohydrates. Karbo kicin kara heerka sonkorta dhiigga, laakiin waxay u baahan yihiin in lagu daro cuntada aad cunto. Karbo siiyaan vitamins, macdanta, iyo fiber kuwaas oo qayb muhiim ah waana cunto caafimaad leh. Antonieta LovelessKarbo waxaa laga helaa Tollie [place], khudradda, Windell Norfolkmiraha isu dhan, waxyaabaha caanaha laga sameeyo, digir, iyo cuntooyinka ay ku jiraan sonkorta lagu daray. Cun cuntooyin caafimaad leh. Bedelaada 3 cunto 3 fudud. Yeelo miisaan caafimaad leh faa'iido. Caadiga guud ee la filayo miisaanka faa'iido kala duwan yihiin sida ay prepregnancy mass index jirka (BMI). Soo qaado Vianne Bullskaarka ah digniin caafimaad ama xirtaan aad isku sharrixi digniin caafimaad. Qaado carbohydrate fudud 15-garaam aad mar walba si loo daaweeyo sonkorta hoos u dhiig (hypoglycemia). Qaar ka mid ah 15-garaam fudud carbohydrate ka mid ah: Kaniiniyo Glucose ah, 3 ama 4. Gel Glucose, tube 15-garaam. Sabiib ah, 2 qaado (24 g). Jelly digir, 6. Theresia LoBuskudka Xoolaha, 8. Casiirka, soodhada caadiga ah, ama caano subagga ku yar, 4 ounces (120 ml). Gummy kuula dhaqmo, 9. Aqoonsadaan hypoglycemia. Hypoglycemia Selinda Flavinxilliga [place]uurka waxay dhacdaa iyada oo heerka sonkorta dhiigga ee 60 mg / dL oo hoos ku qoran. Halista ah ee hypoglycemia Sydell Axonkordhiyaa marka Soon ah, ama cunto, [place] lagu jiro ama ka dib jimicsiga aad u daran, iyo marka la seexdo. Calaamadaha hypoglycemia ka mid noqon kara: Gariirid ama ayna idinla dhaqdhaqaaqdo. Hoos u dhac awooda in ay Turkeyxooga saarto. Dhidid. Garaaca wadnaha oo kordha. Madax xanuun. Afka oo qalala. Gaajada. Elder NegusXanaaq. Walaaca. Hurdo Ciladda. Lance MorinLoo  Beddeli Karin hadal ama isuduwidda. Jahawareer. Ula dhaqan hypoglycemia si dhakhso ah. Haddii aad tahay heegan iyo awoodin in ay si nabad ah la gooyo, raaco xeerka 15:15: Qaado 15-20 grams oo glucose deg deg-simaha ama carbohydrate. Fursadaha Rapid-simaha waxaa ka mid ah gel glucose, kaniini oo gulukoos ah, ama 4 ounces (120 ml) oo casiir miro, soodhada caadiga ah, ama caano subagga ku yar. Tijaabi heerka sonkorta dhiigga 15 daqiiqo ka dib markii glucose ah. Qaado 15-20 grams ka badan glucose haddii heerka dhiig ku celiyaan glucose weli waa 70 mg / dL ama wax ka hooseeya. Cun cunto ama cunto fudud 1 saac gudahood mar heerka sonkorta dhiigga caadiga ahayd ku laabto. Ka digtoonow in polyuria (kaadida oo xad-dhaaf ah) iyo polydipsia (harraad ugu xad-dhaaf ah) kuwaas oo calaamadaha hore ee hyperglycemia. An wacyi hore ee hyperglycemia u oggolaanaysa in si degdeg ah daaweynta. Ula dhaqan hyperglycemia sida uu bixiyaha xanaanada caafimaadkaaga. Ka shaqaysid ugu yaraan 30 daqiiqo ee dhaqdhaqaaq jirka ah maalin ama sida uu bixiyaha xanaanada caafimaadkaaga. Toban daqiiqo oo jimicsi jireed beegay 30 daqiiqo ka dib cunto Georgiakasta waxaa lagu dhiirigelinayaa inuu xakameeyo heerka sonkorta dhiigga postprandial. Dheellitir aad kacmaha insulin iyo cuntada sida loogu baahan yahay haddii aad bilawdid jimicsi cusub ama isboortiga. Raac qorshahaaga maalinta aad jiran-mar kasta oo aadan awoodin in aad cunto ama cabto sida caadiga ah. Iska ilaali isticmaalka khamriga iyo tubaakada. Xafid dhammaan booqashooyinka daba-sida uu bixiyaha xanaanada caafimaadkaaga. Raac talada bixiyaha daryeelka caafimaadka ee ku saabsan dhalmada kahor iyo ballamaha post-gaarsiinta (dhalmada), qorshaha cuntada, jimicsiga, daawooyinka, vitamins, baaritaan dhiig, baaritaan kale oo caafimaad, iyo hawlaha jirdhiska. Mahala MenghiniQabashada maqaarka maalin kasta iyo daryeelka cagta. Baar maqaarkaaga iyo cagaha maalin dillaac, [place], guduudasho, dhibaatooyin  ciddiyaha, dhiig, finan, ama nabarro. Caday ilkahaaga iyo ciridkaaga ugu yaraan  laba jeer maalintii oo isaga nadiifi ugu yaraan hal mar maalintii. La soco ilkaha joogto ah. Elton SinQorsheyso baadhitaan indho inta lagu guda jiro saddexda bilood ee ugu horeeya ee aad [place]uurka leedahay ama sida uu bixiyaha xanaanada caafimaadkaaga. La wadaag Elby Beckqorshaha maamulka sonkorowga aad la leedahay Bea Graffgoobta shaqada ama dugsiga. Joog-u-taariikhda tallaalada. Baro loo maareeyo walwalka. Inaad hesho waxbarasho diabetes socda iyo taageero sida loogu baahan yahay. Baro iyo tixgelin naas nuujinta ilmahaaga. Waa in aad leedahay heerka sonkorta dhiigga 6-12 toddobaad ka dib dhalmada. Waxa lagu sameeyaa baaritaan dulqaad glucose afka ah (OGTT). Raadso daryeel caafimaad haddii: Waxaad awoodi karin in ay cunaan cunto ama cabitaan dheecaanka muddo ka badan 6 saacadood. Waxaad lallabo iyo matag ka badan 6 saacadood. Waxaad heerka sonkorta dhiigga ee 200 mg / dL ah oo aad leedahay ketones in Eritreakaad[place]. Waxaa jira isbedel ku xaaladda maskaxeed. Waxaad qaadi dhibaatooyinka aragga. Waxaad madax xanuun joogta ah. Waxaad calool xanuun sare ama raaxo la'aan. Waxaad qaadi cudur halis ah oo dheeraad ah. Waxaad shuban mudo ka badan 6 saacadood. Idinku waad jiran ama uu xumad ah labo maalmood oo aan la sii fiicnaanayaa. RAADSO DEG DEG daryeel caafimaad haddii: Waxaad neefsashada oo dhib ah. Waxaad mar dambe ma dareemaan ilmuhu dhaqaaqo. Waxaad dhiig ama dheecaan leedahay hoostaada ka. Waxaad bilaabi qabashooyin dhicis ah ama shaqada. Aad u hubsato: Faham tilmaamaha. Daawan Lowella Dandydoontaa xaaladdaada. Doonaa kaalmo raadso haddii aad si fiican haatan uma ama uu ka sii. Document sii daayay, 04/15/2000 Document dib loo saxay, 05/24/2013 Document Dib: 08/06/2011 [employer] [place]  [address]2015 [employer] Macluumaadkan looguma talagalin inay talo siin aad ka hesho daryeel bixiyaha caafimaadka. Hubi inaad kala hadashaa wixii su'aalo ah ee aad  ka qabto bixiyahaaga daryeelka caafimaad.  Saddexda Bilood ee Saddexaad ee [place] Saddex biloodlaha saddexaad waa ka toddobaadka 29 marayo todobaadka 42, 7 bilood ilaa iyo 9. Saddex biloodlaha saddexaad waa waqti marka ilmaha caloosha si xawli ah. Dhamaadkii bishii sagaalaad, ilmaha caloosha ku saabsan tahay 20 inji oo dherer ah iyo culeyskiisu 6-10 pounds. Jirkaagu wuxuu isku badalayaa Jidhkaagu uu soo marayo isbedel badan [place] aad uurka leedahay. Isbedel ayaa ku kala duwan yihiin dumar naagtii. Your miisaanka sii wadi doonaa in [place] kordhiyo. Waxaad filan kartaa in ay helaan (11-16 kg) 25-35 rodol dhammaadka uurka. Waxaa kugu billaaban kara in aad hesho calaamadaha fiditaanka aad miskaha, caloosha, iyo naasaha. Waxaad yaabaa inay kaadi badan, maxaa yeelay, ilmaha caloosha ku guurayaan hoose galay miskaha oo ku cidhiidhinaya ku kaadi. Waxaa kugu dhici karo ama ku wadaan inay si laab sabab u ah aad uurka leedahay. Waxaad qaadi laga yaabaa in calooshu sababtoo ah hormoonada qaarkood ay sabab u yihiin murqaha riixdo qashinka iyada oo mindhicirrada si aad u yarayso. Waxaa laga yaabaa inaad u horumariyaan babaasiirta ama barar, ferjiga xididdada (xididdada qaradhku). Waxaa laga yaabaa inaad xanuun miskaha sababta oo ah korodhka miisaanka iyo hormoonnada uurka nasto goysyada aad u dhexeeya lafaha miskaha ee aad. Dhabar Joaquim Namkeeni karta culaysdhaaf muruqyada taageero Dhehana. Waxaa laga yaabaa inaad isbedel ku timahaaga. Kuwaas waxaa ka mid noqon kara wax kuusan oo aad timaha, Pan[place] saa'idka ah, iyo isbeddel ah ee ka sameysan. Dumarka qaarkood ayaa sidoo kale timaha oo [place] lagu guda jiro ama uurka, ama timaha in uu dareensan yahay qalalan ama khafiif ah ka dib. Timahaagu u badan tahay inay ku soo laaban doona ka dib markii ay si caadi ah marka ilmahaagu dhasho. Naasahaaga sii wadi doontaa in ay korto iyo damqasho. Dheecaan A jaalaha ah waxa laga yaabaa in uu soo daato naasaha aad loo yaqaan dambar. Xudunta  calooshaa way soo baxdaa. Waxaa laga yaabaa  in aad dareento in neeftu kugu gaaban tahay, sababtoo ah ilmo-galeenkaaga ballaariyo. Waxaad ka dareemi kartaa "go'in," ilmaha caloosha ama soconaya hoose ee caloosha. Waxaa laga yaabaa in dheecaan ka xab dhiig leh. Tani waxay badanaa ka dhacdaa dhowr maalmood si ay todobaad ka hor ay fooshu bilaabanto. Afka noqdo khafiif ah oo jilicsan (effaced) u dhow taariikhda. Maxaa laga filayaa Imtixaanka dhalmada Waxaad heli doontaa imtixaanka dhalmada ka hor 2 todobaad kasta ilaa toddobaadka 36. Markaas, waxaad u baahan doontaa imtixaanka dhalmada ka hor todobaadle ah. Intii lagu guda jiray booqasho dhalmada ka hor caadiga ah: Waxaad la miisaamay doonaa si loo hubiyo in [place] iyo ilmaha caloosha ku sida caadiga ah u koraya. Cadaadiska dhiiggaaga ayaa la qaaday. Calooshaada la qiyaasi doonaa si ay ula socdaan koritaanka ilmahaaga. Garaaca wadnaha ayaa uurjiifka la maqlo doonaa in. Natiijada imtixaanka kasta oo ka booqashada hore laga wada [employer]. Waxaa laga yaabaa inaad jeeg afka ilmo-galeenka ku dhow taariikhda ay sabab u arko haddii aad effaced. Abaarihii 36 toddobaad, daryeelahaagu wuxuu hubin doonaa ilmo galeenka. Acey Lav, daryeelahaaga ayaa sidoo kale samayn doonaa tijaabo ah oo ku saabsan dheecaanka unugyada taranka. Baaritaankan waa in la go'aamiyo haddii nooc ka mid ah bakteeriyada, Group B streptococcus, waa la joogo. Daryeelahaagu taas kuu sharrixi doona dheeraad ah. Daryeelahaagu laga yaabaa in aad weydiiso: Waa maxay qorshaha aad ku dhalatay yahay. Sida aad dareensan tahay. Haddii aad dareensan tahay dhaqaaqo ilmaha. Haddii aad wax calaamado aan caadi [place], sida dareere deynayo dareeraha, dhiig, madax xanuun daran, ama casiraad caloosha. Haddii aad qabto wax su'aalo ah. Tijaabooyin kale ama baaritaanka in lagu sameeyo inta aad bilood saddexaad waxaa ka mid ah: Tijaabooyinka dhiiga in hubi u ah heerarka hooseeya birta  (anemia). Imtixaanka [place] si loo hubiyo caafimaadka, heerka waxqabadka, iyo koritaanka ilmaha caloosha. Baaritaanka waxaa loo sameeyaa haddii aad leedahay xaalado caafimaad oo gaar ah ama haddii ay jiraan dhibaatooyin [place] lagu guda jiro uurka. SHAQAALAHA BEEN Waxaad dareemi kartaa yar, foosha aan joogto ahayn in ugu danbeyn tag. Devonne Doughty waxa loo yaqaan Deberah Pelton, ama shaqada been ah. Fool-socon Adaline Sill, Artelia Laroche, ama todobaadyo ka hor [place] nooc shaqada run ah. Haddii foosha ku timaado si joogto ah, badiya, ama ay xanuun [place], waxaa fiican in la arko in aad daryeelaha. Calaamadaha Foosha Caadada-sida xanuun. Fool-in ay yihiin dhexeyso 5 daqiiqadood ama wax ka yar. Fool in bilowdo sare ee ilmo-galeenka oo sii faafin hoos caloosha hoose iyo dib. Dareen cadaadiska miskaha kordheen ama dhabar xanuunka. Dheecaan A-biyood ama dhiig leh xab ka soo baxa siilka. Haddii aad qabto wax ka mid ah calaamadahaan ka hor toddobaadka 37aad ee [place], wac daryeelaha [employer] Waxaad u baahan tahay in ay tagaan isbitaalka si aad u degdeg ah loo hubiyaa. CARE HOME [facility] Ka fogow cabidda sigaarka oo dhan, iyo geedo yaryar, [place], iyo daroogada unprescribed. Esaw Grandchild saamayn dhismaha iyo koritaanka ilmaha. Raac tilmaamaha aad daryeelaha ee ku saabsan isticmaalka daawada. Waxa jira dawooyin waxaa ka mid amaan ah ama [place] in ay [place]. Jimicsiga oo kaliya sida uu kuu sheego daryeelaha. Fahmida xanuun minka waa calaamad wanaagsan in la jooji jimicsiga. Sii wad in aad cuni joogto ah, cunto caafimaad leh. Warner Mccreedy ama rajabeetada taageero fiican u danqasho naasaha. Ha isticmaalin tubbo biyo kulul, qolalka [place], ama [place]. Evie Lacks suunka kursiga aad had iyo jeer marka aad wadid gaari. Iska ilaali inaad hilib cayriin, cheese ceedhin, sanduuqyada bisadda, iyo ciidda ay isticmaalaan bisadaha. Kuwani qaado jeermiska keena kara ciladaha ilmaha ku dhacda  ilmaha. Qaado fiitamiinnada Selinda Flavin [place]. Isku day inaad qaadato saxaro jilciye (  haddii aad daryeelaha ansixiyaan) haddii aad dareento in calooshu. Cun cuntooyin ay buunshuhu ku sarreeyaan dheeraad ah oo ku-, sida khudaar daray ah ama miro iyo miraha isu dhan. Cab cabitaano badan si aad kaadida cad ama jaalle midab. Ku qabeyso biyo qandiir sixinka oo dejiyo xanuun ah ama lur [place] babaasiirta. Isticmaal kareem hemorrhoid haddii aad daryeelaha ansixiyaan. Haddii aad yeelato veins, xirtaan tuubada taageero. Sare u cagihiinna 15 daqiiqo, 3-4 jeer [employer]. Xaddid cusbada cuntada. Iska ilaali qaadista wax culus, xidhataa hooseeyo kabaha la bogsiiyo, oo ay ku dhaqmaan qaab fiicanna. Saar badan iyada oo lugahaagu Qolol haddii aad qabtid casiraad ama dhabar xanuunka hooseeyo. Booqo dhakhtarkaaga ilkaha haddii aadan ka baxeen [place]. Isticmaal caday jilicsan si aad ilkaha oo noqon kuwa ilaaq badan marka aad nadiifi. Kathryne Sharper A way sii haddii aad daryeelaha idiin Dardaarmi haddii kale. Miyeyna ku Soconin masaafo fog haddii ay tahay lagama maarmaan oo kaliya ansixinaya daryeelahaaga. Qaado fasallada dhalmada ka hor si ay u fahmaan, dhaqanka, iyo weydii su'aalo ku saabsan foosha iyo dhalmada. Samee tijaabo ah oo ay maamulaan si ay isbitaalka. Diyaarso bac ay isbitaalka. Diyaari xanaanada ilmaha. Sii wad in aad u tagtaan inaad booqashooyinka dhalmada ka horreeya oo dhan sida uu kuu sheego daryeelaha. Raadso daryeel caafimaad haddii: Waxaad hubin haddii aad ku jirto shaqada ama hadday Biyuhu wuu jebiyey. Waxaad wareer. Waxaad xanuun [place] ah miskaha, cadaadiska [place], ama xanuun [place] aad degan tahay caloosha. Waxaad lallabo joogto ah, matag, ama shuban. Waxaad dheecaan xun oo qarmuun siilka. Waxaad xanuun [employer]. RAADSO DEG DEG daryeel caafimaad haddii: Waxa aad leedahay qandho. Waxaad ka tifqayaan dheecaan siilkaaga. Waxaad dhibicdibic ah ama dhiig-bax  hoostaada ka. Aad casiraad caloosha daran ama xanuun. Waxaad miisaanka degdeg ah khasaaro ama faa'iido. Waxaad neefta oo ku qabata la xabad xanuun. Inaad dareentid barar lama filaan ah ama xad dhaaf ah oo aad wajiga, gacmaha, kuraamaha, cagaha, ama lugaha. Waxaad aan dareemay ilmahaaga dhaqaaqo in ka badan saacad. Waxaad madax xanuun daran oo aan iska daawo tagaan. Waxaad araggaagu isbeddelo. Document sii daayay, 01/01/2001 Document dib loo saxay, 01/12/2013 Document Dib: 03/10/2012 [employer] [place]  [address] [place]. Macluumaadkan looguma talagalin inay talo siin aad ka hesho daryeel bixiyaha caafimaadka. Hubi inaad kala hadashaa wixii su'aalo ah ee aad ka qabto bixiyahaaga daryeelka caafimaad.  Naas- Go'aansiga naaska waa mid ka mid ah doorashooyinka ugu wanaagsan ee aad sameyn kartaa adiga iyo ilmahaaga. Bedelka hormuunada Selinda Flavin uurka sababa unugyada naaska si aad u koraan iyo kordhiyaa tirada iyo baaxadda caanuhu aad. Hoormoonka wuxuu sidoo kale u oggolaadaan borotiin, sonkor, iyo dufanka ka sahayda dhiigga si aad u caanaha naaska ee qanjidhada caano-aad samayneyso. Hormoonnada looga hortago in caanaha naaska ka hor [employer] la sii deynin ilmahaagu dhasho iyo sidoo kale socodka caanaha dhakhso ah dhalashada ka dib. Marka naasnuujinta ayaa bilowday, waana fikirro ilmahaaga, iyo sidoo kale iyada oo uu ama nuugo ama oohin, kicin kara la sii daayo caanaha ka qanjirada caanaha soo saara aad. Faa'idooyinka naasnuujinta Wixii Your Baby Your caanaha ugu horreeya (dambar) ka caawiyaa shaqo dheefshiidka ilmahaaga si wanaagsan. Waxaa jira antibodies in aad caano in caawiso ilmahaaga inuu iska caabiyo cudurada. Ilmahaagu waxa uu leeyahay dhacdooyinka hoose ee neefta, xasaasiyad, oo lama filaan ah dhimashada dhallaanka syndrome. The nafaqooyinka caanaha naaska si fiican aad u badan qaaciidooyinka dhallaanka ilmaha yihiin oo si gaar ah waxaa loogu talagalay baahida ilmahaaga. Caanaha  naaska hagaajinaysaa [employer]. [place] waa yar tahay in ay horumariyaan xaalado kale, sida cayilka carruurnimada, neef, ama nooca 2 diabetes mellitus. Waayo, waxaad Naas-nuujinta waxay ka caawisaa  in la abuuro bond aad u gaarka ah ee u dhexeeya adiga iyo ilmahaaga. Naas-nuujintu waxa haboon. Caanaha naaska had iyo jeer waa la heli karaa heerkulka saxda ah iyo kharash waxba. Naas-nuujinta waxay ka caawisaa si ay u gubaan calories oo kaa caawinaysaa in aad lumiso miisaanka biirey [place]. Naas-nuujinta si size prepregnancy ay heshiis ilmo galeenka oo aad si degdeg ah oo loogu talla galay dhiig (uub) ka dib umusha. Naas-nuujinta ayaa kaa yareyn kara halista nooca 2aad ee sonkorowga mellitus, lafo, iyo naasaha ama [place] ugxan sidaha dambe ee nolosha aad. CALA[place]ADA[place] in [place] gaajaysan yahay [place] [employer] feejigan la kordhiyo ama waxqabad. [place]. Movement of madaxa dhinac ilaa dhinac. ZOXWRUEAVWUJ madaxa iyo furitaanka ee afka marka geeska afka ama dhabanka waxaa stroked (afjarno). Kordhinta jaqaan dhawaaqa, dibnaha, muusiqada, taahid, ama xooga. Gacan-ka-afka [id number]. Caanonuug dheeraad ah oo faraha ama gacmaha. [place] [employer]. Ka dhex qaylinaya. Ba'an [place] Hunger [place] gaajo xad dhaaf ah u baahan doonaa dejinaaya oo u tacsiyaynaysay ka hor inta ilmahaaga awood u yeelan doonaan in ay si guul leh u naasnuujin. Ha sugin in calaamadaha soo socda gaajo xad dhaaf ah in ay dhacaan ka hor inta aanad la bilaabo naas-nuujinta, Degenaansho la'aanta. A cod weyn, oo qayladayda u xoog badan. Qeyliyo. [place] jiqi Naas-nuujinta Bilaabayo Raadi meel raaxo leh u fadhiiso ama jiifso, oo aad qoorta iyo dhabarka si fiican u taageeray. Dhig barkimo ama buste la duubay oo hoos ilmahaaga in ay isaga ama iyada ku soo dejin heerka [place] (haddii aad ka fadhiisanayso). [employer] waxaa si gaar ah loogu talagalay in lagu  caawiyo gacmahaaga iyo ilmahaaga u taageeraan [employer]. Hubi in caloosha [place]maha[place] waxaa soo food saartay calooshaada. Qunyar salaax naasahaaga. Mickle Asper, massage ka derbiga xabadka xagga ibta in tirid. Tani waxay dhiiri socodka caanaha. Waxaa laga yaabaa inaad u baahan tahay in ay sii wadaan ficilkan waqtiga quudinta haddii caanaha si tartiib ah u socodka. [employer] la 4 faraha hoose iyo suulka kor ku ibta naaska. Hubso in farahaagu yihiin meel ka baxsan ibta [place] oo afka [place]aga. Stroke bushimaha ilmahaaga si tartiib leh farta ama ibta [place]. Marka afka [place] u furan yahay balaadhan oo ku filan, si deg deg ah ilmahaaga in aad ka keentid [place]aska, gelinayn ibta aad oo dhan iyo sida badan ee aagga midab ku wareegsan ibta [place] (ibta) intii suurto gal ah afka [place] galay. [place] More waa in laga arki kor ku xusan ee [place]mahaaga oo bushintiisa sarena ha ka badan hoos ku dibinta hoose. [place] ayaa carrabka waa inuu u dhexeeyo iyada oo uu xanjada ama hoose iyo [place]. Hubi in afka [place] meel sax ah ku wareegsan ibta [place] (shabaqa). Bushimahaaga [place] waa in la abuuro Lebaa daabacay on naaska oo la soo baxay (everted). Waa caadi in [place]maha[place] nuugo oo ku saabsan 2-3 daqiiqo si ay u bilaabaan socodka caanaha [place]. Johney [place] Baridda [place] sida loo qabsado on [place] si sax ah waa mid aad u muhiim ah. Dusiyey An aan habboonayn keeni kartaa xanuun ibta naaska iyo hoos u siin caanaha adiga iyo dadka saboolka ah ee korodhka miisaanka [place]. [place] kale, haddii [place]maha[place] uusan shabaqa rogtid oo aad ibta naaska si sax ah, isaga ama iyada waxaa laga yaabaa in la gooyo qaar ka mid ah hawada lagu jiro quudinta. Murrell Redden waxay ka dhigi kartaa [place] ooyo. Daacsiinta ilmahaaga marka aad iska badasho [place] waqtiga quudinta ayaa kaa caawin kara in laga takhaluso hawada. Si kastaba ha ahaatee, wax baraya in [place] qabsado on si sax ah ay tahay sida ugu wanaagsan ee looga  hortago jeclayn ka liqidda hawada halka naas nuujinta. [place] in [place] uu si guul ah shabaqa ku saabsan in [place]: [employer] ama caanonuug aamusan, oo aan aad u [place] xanuun. Liqidda [employer] u dhaxaysa 3-4 [employer]ue [employer]. Dhaq-dhaqaaqa muruqa kor ku xusan iyo in hore ee isaga ama iyada dhegaha halka uu nuugo. Calaamad[place] in ilmahaagu uu si guul ah ma shabaqa ku saabsan in [place] na[place]: Nuugista dhawaaqa ama dilin dhawaaqa ka soo ilmahaaga halka naas nuujinta. Nelida Gores ibta naaska. Haddii aad u malaynayso in [place] uusan shabaqa ku sax, simbiriirixan fartaada u geli geeska afka ilmahaaga si uu u jabiyo nuugid iyo meel u dhaxaysa ciridka ilmahaaga. Isku-dayga bilaabidda naas-nuujinta mar kale. [place] Naas Successful Calaamad[place] ka ilmahaagu: Hoos u dhac tartiib ah tirada nuugayo ama joojin buuxda oo caanonuug ah. Gam'in. Nasashada isaga ama Nada Libman. Haynta ee qadar yar oo ah caano ku afkiisa ama afkeeda. Hadaad u ogolaato in go naaskaaga naftiisa ama nafteeda by. Calaamadaha aad ka: Naasaha in ayaa ku soo kordhay heleen adayg, miisaanka, iyo size 1-3 saacadood ka dib quudinta. Naasaha in ay yihiin jilicsan si degdeg ah ka dib markii naas-nuujinta. Mugga caanaha la kordhiyo, sidoo kale wax isbedel ah ee joogta ah caano iyo midab by maalintii shanaad ee naas-nuujinta. Ibta oo aan xanuun, dillaac, ama dhiig-bax. [place] Sheegaya in [place] waxa uu helaa caano ku filan Qoynta ugu yaraan 3 xafaayadda in muddo 24-saac ah. Kaadida waa in uu ahaadaa mid cad oo midab jaalle midab da'da 5 maalmood. Ugu yaraan 3 saxaro in muddo 24-saac ah oo ay da'doodu 5 maalmood. Saxarada waa in uu ahaadaa jilicsan iyo jaalaha ah. Ugu yaraan 3 saxaro in muddo 24-saac ah oo ay da'da 7 maalmood. Saxarada waa in uu ahaadaa iniinyaha iyo jaalaha ah. No luma miisaan ka badan 10% ka mid ah miisaanka dhalashada [place] lagu jiro 3 maalmood ee ugu horreeya da'da. Celceliska korodh miisaan  oo ah 4-7 ounces (113-198 g) toddobaadkii da'da 4 maalmood ka dib. Joogtada ah maalin kasta miisaanka faa'iido da'da 5 maalmood, iyada oo aan miisaan lumis ka dib da'da 2 todobaad. Ka dib quudinta ah, ilmahaaga laga yaabaa in qadar yar. Tani waa caadi. Miles Costain jeer naas-nuujinta iyo MUDDADA Quudinta joogta caawinayaan inaad caano badan oo ka hortagi kara ibta iyo naasaha oo. Naas marka aad dareento baahida loo qabo in la yareeyo tirada buuxda oo naasahooda ama marka ilmahaagu wuxuu muujinayaa calaamadaha gaajada. Tan waxa loo yaqaan "naas-nuujinta ku saabsan baahida loo qabo." Iska ilaali inaad soo bandhigid mujuruca aad ilmaha ka markii aad shaqaynayso in la dhiso naas-nuujinta (4-6 toddobaad ee ugu horreeya ka dib marka ilmahaagu dhasho). Ka dib wakhtigan waxaad dooran kartaa inaad isticmaasho mujuruqa. Cilmi baaris ayaa muujisay in isticmaalka [employer] lagu jiro sannadka ugu horeeya ee nolosha ilmaha hoos u halista ah ee lama filaan ah dhimashada dhallaanka syndrome (SIDS). U ogolow ilmahaaga in ay ku quudiyaan naas kasta ilaa iyo inta uu rabo. Naas ilaa ilmahaagu ka dhargo. [facility] ilmahaaga unlatches yahay ama u dhacaa hurda halka quudinta [place] hore, naaska labaad. Sababtoo ah dhasha inta badan waa hurdo ah toddobaadyada ugu horeeya ee nolosha, waxaa laga yaabaa in aad u baahan tahay si ay u toosin ilmahaaga si aad u hesho isaga ama iyada in ay ku quudiyaan. Jeer Naas kala duwan yihiin min ilmo in ilmaha. Si kastaba ha ahaatee, xeerarka soo socda shaqayn karo, isagoo ah hanuuniye si ay Wellsite geologist in aad hubiso in ilmahaaga si fiican loo quudin: Dhallaanka (ilmaha 4 toddobaad ah ama ka yar) waxaa laga yaabaa in naaska 1-3 saacadood ee kastaba.  Dhallaanka waa in [place] tago muddo ka badan 3 saacadood maalintii ama 5 saacadood habeenkii aan naas-nuujinta. Waa in aad naaska ilmahaaga ugu yaraan 8 jeer muddo 24-saac ah ilaa aad bilaabaan inay soo bandhigo cuntada adag ilmahaaga qiyaastii 6 bilood  jir. Shubto caanaha naaska Lisida iyo Kaydinta Caanaha [place] kuu ogolaanayaa inaad si ay u hubiyaan in [place]mahaagu caanaha naaska oo aad si gaar ah quudin, xitaa waqtiyada marka aanad awoodin inaad [place]. Murrell Redden waxay gaar ahaan muhiim ah haddii aad dib ugu noqonayaan in ay ka shaqeeyaan [place] aad weli naas nuujinta ama aad marka aadan awoodin in uu la joogo marka aad quudineysid. Taliye Your nuujinta ku siin karaan tilmaamaha ku saabsan sida dheer nabdoon tahay in la [place]. Lisa naaska waa mashiin kuu ogolaanaya inaad si tuuro caanaha naaska ka galay dhalo nadiif ah. Ayaa Jeronimo Norma gareeyeen ka dibna lagu Lenord Fellers qaboojiyaha ama qaboojiyaha. Bambooyin naaska Qaar ka mid ah waxaa ka shaqeeya gacanta, halka qaar kalena ay u isticmaalaan koronto. Weydii la taliyaha nuujinta aad ugu wanaagsan oo nooca idiin [employer]. Bambooyin naaska laga Sudie Grumbling, laakiin qaar ka mid ah isbitaalada iyo kooxaha taageerada naas nuujinta kiraysan bambooyin naaska ee ku salysan bishiiba. Taliye nuujinta A idin bari karaa sida loo dhiibi caanaha [place] express, haddii aad jeceshahay in aadan isticmaalin bamka ah. Daryeelka in [place] aad naaska Ibta noqon kartaa qalalan, dillaac, iyo xanuun, halka naas nuujinta. Ayaa talooyinka soo socda ayaa kaa caawin kara in aad naasahaaga sii moisturized oo caafimaad leh: Iska ilaali inaad isticmaashid saabuun ibta. Xiro rajabeeto taageero. [place] oo aan looga baahan yahay, dushooda xirashada kalkaalinta iyo haanta gaar ah waxaa loogu talagalay in ay oggolaadaan in naasahaaga si aad u naas-nuujinta aan iska rajabeeto aad oo dhan ama sare. Iska ilaali xiran underwire-style xirashada ama xirashada aad u dhagan. Air qallaji ibahaaga 3-4 daqiiqo ka dib [place]. Isticmaal pads rajabeeto suuf oo kaliya si uu u nuugo caanaha naaska xaday. Daadashada kaadida caanaha naaska u dhexeysa quudinta waa caadi. Isticmaal lanolin ibta ka dib nuujinta. Lanolin ka  caawisaa si ay u ilaaliyaan ooday qoyaan caadiga ah maqaarka ee. Haddii aad isticmaasho lanolin oo saafi ah, uma baahnid inaad u baahan tahay inaad iska maydho ka hor [place] ilmahaaga. Lanolin ka daahirane ah ma aha sun ah in ilmahaaga. Waxa kale oo aad gacanta laga yaabaa inay muujiyaan dhawr dhibcood oo caanaha [place]aska iyo caanaha si tartiib ah in [place] u oggolaadaan in caanaha in hawada qallalan. Todobaadyada hore kadib marka aad dhasho, haweenka qaarkood dareemaan in [place] aad u buuxa (bararta). Darar Dedra Skeens dhigi kartaa naasahaagu aad u culus, diiran, oo dhaylo ah in la taabto. [place] fooca 3-5 maalmood gudahood ka dib markii aad ku dhasho. Ayaa talooyinka soo socda ayaa kaa caawin kara xidhitaanka iska istareexsan! Wada faaruqiso naasahaaga halka naas nuujinta ama la liso. Waxaa laga yaabaa inaad doonayso inaad kaga bilowdo by Rolm Baptise qoyan (qubeyska ama tuwaalada gacanta biyo-qooyay diiran) ka hor quudinta ama la liso. Taasi waxay Shawnee Knapp wareegga iyo caawisaa socodka caanaha. Haddii ilmahaagu aanu si buuxda u faaruqiso [place] halka naas nuujinta, lisidda wax caano dheeraad ah ka dib markii isaga ama iyada la dhammeeyo. Evie Lacks rajabeeto ah ku xirantaa (nursing ama joogto ah) ama sare taangiga 1-2 maalmood oo astaan ??u ah jirkaaga si wax yar hoos u-soo-saarka caanaha. Codso baraf si aad naasahaaga, haddii tani ay tahay mid aad u raaxo aad u. Hubi in ilmahaagu shabaqa ku taagan yahay si sax ah halka naas nuujinta. Haddii xidhitaanka sii socoto muddo 48 saacadood  ah ka dib markii talooyin ka dib, la xiriir takhtarka daryeelka caafimaadka ama la taliyaha nuujinta ah. TALOOYINK[facility] CARE caafimaadka guud oo inta aad naaska nuujineyso Cun cuntooyin caafimaad leh. Bedelaada dhexeeya cuntooyin iyo cunno fudud, [place] 3 of [place]. Sababtoo ah waxaad cuni saamayn ku caanaha naaska, qaar ka mid ah cuntooyinka dhallaankaaga ka dhigi karaan in ka badan xanaaq badan  sida caadiga ah. Iska ilaali cuntooyinka leh haddii aad hubto in ay si xun u saameeya ilmahaaga. Cab caanaha, casiirka furutada, iyo biyo si aad u qanciso u harraadsan (qiyaastii 10 koob maalintii). Inta badan naso, nastaan, oo ay sii wadaan in ay qaataan fiitamiinnada [place] si looga hortago daal, walaaca, iyo dhiig la'aan. Continue jeeg is-wacyiga naaska. Iska ilaali in ay [place]. Iska ilaali isticmaalka qamriga iyo maandooriyaha. Daawooyinka qaarkood oo laga yaabo inuu waxyeello u leh ilmaha mari kartaa caanaha naaska. Waxaa muhiim ah in aad weydiiso bixiyaha daryeelka caafimaadka ka hor inta uusan qaadan wax dawo ah, oo ay ku jiraan oo dhan-ka-counter iyo dawooyinka la qoro iyo sidoo kale vitamin iyo geedaha. Waxaa suurto gal ah in aad uur yeelato, halka naas nuujinta. Haddii [employer] [place], weydii bixiyaha xanaanada caafimaadkaaga ku saabsan fursadaha in ay noqon doontaa meel ammaan ah oo ilmahaaga. Raadso daryeel caafimaad haddii: Waxaad dareemi sida aad rabto in aad joojiso naas nuujinta ama ay noqdaan dad ka careysan naas-nuujinta. Waxaad naasaha oo ku xanuuna ama ibta. Ibta waxaa dillaac ama dhiig-bax. Naasahaaga waa casaan, tabar daranyahay, ama diiran. Waxaad meel bararsan naaska midkood. Waxa aad leedahay qandho ama qar-qaryo. Waxaad lalabbo ama matagid. Waxaad dheecaan kale oo aan ahayn caanaha naaska ka ibta. Naasahaaga ma noqon buuxa ka hor [place] by [employer] ka dib umusha. Waxaad dareemeysaa murugo iyo niyad-jab. [place] waa mid aad u hurdo si fiican u cunin. [place]maha[place] waa dhib hurdada. [place] waxa uu ka yar yahay 3 qoynta xafaayadda in muddo 24-saac ah. [place] waxa uu leeyahay in ka yar 3 saxaro in muddo 24-saac ah. Maqaarka ilmahaaga ama qaybta cad ee isaga ama iyada indhaha oo huruud noqdo. [place] aan miisaan 5 maalmood da'da. RAADSO DEG DEG daryeel caafimaad haddii: [place] waa kacsan daal (itaal darnaadaa)  oo ma doonayo in [place] u kiciyo oo lagu quudiyo. [place] waxa uu yeeshay qandho ah oo aan la sharrixin. Document sii daayay, 01/07/2005 Document dib loo saxay, 01/12/2013 Document Dib: 07/01/2012 [employer] [place]  [address], [place]. Macluumaadkan looguma talagalin inay talo siin aad ka hesho daryeel bixiyaha caafimaadka. Hubi inaad kala hadashaa wixii su'aalo ah ee aad ka qabto bixiyahaaga daryeelka caafimaad.

## 2013-12-31 NOTE — Progress Notes (Signed)
[employer] Interpreter# [id number] NST/AFI/OBF

## 2013-12-31 NOTE — Progress Notes (Signed)
NST reviewed and reactive. U/S for growth next week. FBS 80-89 2 hr pp 100-110 Currently declining IOL at 39 wks, discussed risk with previous IUFD.  Will allow IOL at 40 wks.

## 2014-01-03 ENCOUNTER — Encounter: Payer: Self-pay | Admitting: *Deleted

## 2014-01-03 ENCOUNTER — Ambulatory Visit (INDEPENDENT_AMBULATORY_CARE_PROVIDER_SITE_OTHER): Payer: Medicaid Other | Admitting: *Deleted

## 2014-01-03 ENCOUNTER — Encounter: Payer: Self-pay | Admitting: Family Medicine

## 2014-01-03 VITALS — BP 110/70 | HR 85

## 2014-01-03 DIAGNOSIS — O09293 Supervision of pregnancy with other poor reproductive or obstetric history, third trimester: Secondary | ICD-10-CM

## 2014-01-03 DIAGNOSIS — O403XX1 Polyhydramnios, third trimester, fetus 1: Secondary | ICD-10-CM

## 2014-01-03 NOTE — Progress Notes (Signed)
Category 1 tracing with baseline in 130s.  Moderate variability, multiple accelerations, no decelerations.  

## 2014-01-06 ENCOUNTER — Ambulatory Visit (INDEPENDENT_AMBULATORY_CARE_PROVIDER_SITE_OTHER): Payer: Medicaid Other | Admitting: Obstetrics & Gynecology

## 2014-01-06 ENCOUNTER — Ambulatory Visit (HOSPITAL_COMMUNITY)
Admission: RE | Admit: 2014-01-06 | Discharge: 2014-01-06 | Disposition: A | Discharge status: Home / Self Care | Payer: Medicaid Other | Source: Ambulatory Visit | Attending: Obstetrics and Gynecology | Admitting: Obstetrics and Gynecology

## 2014-01-06 ENCOUNTER — Ambulatory Visit (HOSPITAL_COMMUNITY): Payer: Medicaid Other

## 2014-01-06 VITALS — BP 102/70 | HR 92 | Temp 97.9°F | Wt 198.7 lb

## 2014-01-06 DIAGNOSIS — O0943 Supervision of pregnancy with grand multiparity, third trimester: Secondary | ICD-10-CM | POA: Insufficient documentation

## 2014-01-06 DIAGNOSIS — O0993 Supervision of high risk pregnancy, unspecified, third trimester: Secondary | ICD-10-CM

## 2014-01-06 DIAGNOSIS — O24419 Gestational diabetes mellitus in pregnancy, unspecified control: Secondary | ICD-10-CM | POA: Diagnosis not present

## 2014-01-06 DIAGNOSIS — O09293 Supervision of pregnancy with other poor reproductive or obstetric history, third trimester: Secondary | ICD-10-CM

## 2014-01-06 DIAGNOSIS — O24410 Gestational diabetes mellitus in pregnancy, diet controlled: Secondary | ICD-10-CM

## 2014-01-06 DIAGNOSIS — O403XX1 Polyhydramnios, third trimester, fetus 1: Secondary | ICD-10-CM

## 2014-01-06 NOTE — Patient Instructions (Signed)
Return to clinic for any obstetric concerns or go to MAU for evaluation  

## 2014-01-06 NOTE — Progress Notes (Signed)
Language line Somali interpreter [phone] Blood sugars within range, one abnormal dinner PP in 130. Continue diet control NST performed today was reviewed and was found to be reactive.  Continue recommended antenatal testing and prenatal care. Growth scan today; will follow up results and manage accordingly. Patient declined IOL at 39 weeks even with history of IUFD. Understands our concern, and she understands risk involved.  IOL scheduled on 01/19/14 at 0730, patient informed of this date and time.  No other complaints or concerns.  Labor and fetal movement precautions reviewed.

## 2014-01-07 DIAGNOSIS — O409XX0 Polyhydramnios, unspecified trimester, not applicable or unspecified: Secondary | ICD-10-CM | POA: Insufficient documentation

## 2014-01-07 DIAGNOSIS — Z3A38 38 weeks gestation of pregnancy: Secondary | ICD-10-CM | POA: Insufficient documentation

## 2014-01-10 ENCOUNTER — Inpatient Hospital Stay (HOSPITAL_COMMUNITY)
Admission: AD | Admit: 2014-01-10 | Discharge: 2014-01-10 | Disposition: A | Discharge status: Home / Self Care | Payer: Medicaid Other | Source: Ambulatory Visit | Attending: Obstetrics & Gynecology | Admitting: Obstetrics & Gynecology

## 2014-01-10 ENCOUNTER — Ambulatory Visit (HOSPITAL_COMMUNITY)
Admission: RE | Admit: 2014-01-10 | Discharge: 2014-01-10 | Disposition: A | Discharge status: Home / Self Care | Payer: Medicaid Other | Source: Ambulatory Visit | Attending: Family Medicine | Admitting: Family Medicine

## 2014-01-10 ENCOUNTER — Encounter (HOSPITAL_COMMUNITY): Payer: Self-pay | Admitting: *Deleted

## 2014-01-10 ENCOUNTER — Ambulatory Visit (INDEPENDENT_AMBULATORY_CARE_PROVIDER_SITE_OTHER): Payer: Medicaid Other | Admitting: *Deleted

## 2014-01-10 VITALS — BP 109/75 | HR 82

## 2014-01-10 DIAGNOSIS — O368390 Maternal care for abnormalities of the fetal heart rate or rhythm, unspecified trimester, not applicable or unspecified: Secondary | ICD-10-CM

## 2014-01-10 DIAGNOSIS — O24419 Gestational diabetes mellitus in pregnancy, unspecified control: Secondary | ICD-10-CM | POA: Diagnosis present

## 2014-01-10 DIAGNOSIS — O24410 Gestational diabetes mellitus in pregnancy, diet controlled: Secondary | ICD-10-CM | POA: Diagnosis not present

## 2014-01-10 DIAGNOSIS — O403XX0 Polyhydramnios, third trimester, not applicable or unspecified: Secondary | ICD-10-CM | POA: Diagnosis not present

## 2014-01-10 DIAGNOSIS — Z3A38 38 weeks gestation of pregnancy: Secondary | ICD-10-CM | POA: Insufficient documentation

## 2014-01-10 DIAGNOSIS — O403XX1 Polyhydramnios, third trimester, fetus 1: Secondary | ICD-10-CM | POA: Insufficient documentation

## 2014-01-10 DIAGNOSIS — O09293 Supervision of pregnancy with other poor reproductive or obstetric history, third trimester: Secondary | ICD-10-CM | POA: Diagnosis not present

## 2014-01-10 NOTE — Progress Notes (Signed)
[employer] Interpreter # [id number] used for visit today.  BPP scheduled today.  IOL scheduled 12/30

## 2014-01-10 NOTE — MAU Note (Signed)
Pt sent to MAU for NST following BPP 6/8.

## 2014-01-10 NOTE — Discharge Instructions (Signed)
You were seen in the MAU for prolonged monitoring which ultimately was reassuring. You should keep your follow up appointment as scheduled on Monday. If you develop contractions, loss of fluid, vaginal bleeding, decreased fetal movement or other concerning symptoms, please seek care sooner.

## 2014-01-10 NOTE — MAU Provider Note (Cosign Needed)
History     CSN: [id number]  Arrival date and time: 01/10/14 1143   None     Chief Complaint  Patient presents with  . Non-stress Test   HPI  Kathryne GinFaduma Dardis is a 33 y.o. [id number] at 4754w5d who presents to the MAU for NST after BPP 6/6 in the ultrasound unit. The patient feels well and is without complaints. +Fetal movement. Denies contractions, loss of fluid, vaginal bleeding.   Per record, patient has polyhydramnios, diet controlled gestational diabetes, and a history of IUFD at term in prior pregnancy. It has been recommended but multiple providers that she be delivered at 39 weeks, but patient has declined this. Induction currently scheduled for 40 weeks.   OB History    Gravida Para Term Preterm AB TAB SAB Ectopic Multiple Living   12 11 11  0 0 0 0 0 0 9      Past Medical History  Diagnosis Date  . Medical history non-contributory   . Diabetes mellitus without complication 2015  . Gestational diabetes 2015    Past Surgical History  Procedure Laterality Date  . No past surgeries      Family History  Problem Relation Age of Onset  . Alcohol abuse Neg Hx   . Arthritis Neg Hx   . Asthma Neg Hx   . Birth defects Neg Hx   . Cancer Neg Hx   . COPD Neg Hx   . Depression Neg Hx   . Diabetes Neg Hx   . Drug abuse Neg Hx   . Early death Neg Hx   . Hearing loss Neg Hx   . Heart disease Neg Hx   . Hyperlipidemia Neg Hx   . Hypertension Neg Hx   . Kidney disease Neg Hx   . Learning disabilities Neg Hx   . Mental illness Neg Hx   . Mental retardation Neg Hx   . Miscarriages / Stillbirths Neg Hx   . Stroke Neg Hx   . Vision loss Neg Hx   . Varicose Veins Neg Hx     History  Substance Use Topics  . Smoking status: Never Smoker   . Smokeless tobacco: Not on file  . Alcohol Use: No    Allergies: No Known Allergies  Prescriptions prior to admission  Medication Sig Dispense Refill Last Dose  . ACCU-CHEK FASTCLIX LANCETS MISC 1 each by Percutaneous route 4 (four)  times daily. 100 each 12 Past Week at Unknown time  . famotidine (PEPCID) 20 MG tablet Take 1 tablet (20 mg total) by mouth 2 (two) times daily. 60 tablet 3 Past Week at Unknown time  . glucose blood (ACCU-CHEK SMARTVIEW) test strip DX Gestational Diabetes 648.83 for testing 4 times daily 100 each 12 Past Week at Unknown time  . Prenatal Vit-Fe Fumarate-FA (MULTIVITAMIN-PRENATAL) 27-0.8 MG TABS tablet Take 1 tablet by mouth daily at 12 noon.   Past Month at Unknown time    Review of Systems  Constitutional: Negative.  Negative for fever, chills and malaise/fatigue.  HENT: Negative.  Negative for congestion and sore throat.   Eyes: Negative.  Negative for double vision and photophobia.  Respiratory: Negative.  Negative for cough, shortness of breath and wheezing.   Cardiovascular: Negative.  Negative for chest pain and leg swelling.  Gastrointestinal: Negative.  Negative for nausea, vomiting, abdominal pain, diarrhea and constipation.  Genitourinary: Negative.  Negative for dysuria, urgency, frequency and hematuria.  Musculoskeletal: Negative.  Negative for myalgias.  Skin: Negative.   Neurological: Negative.  Negative for weakness and headaches.  Psychiatric/Behavioral: Negative.   All other systems reviewed and are negative.  Physical Exam   Blood pressure 107/68, pulse 92, temperature 98 F (36.7 C), temperature source Oral, resp. rate 18, last menstrual period 05/05/2013, SpO2 99 %.  Physical Exam  Nursing note and vitals reviewed. Constitutional: She is oriented to person, place, and time. She appears well-developed and well-nourished. No distress.  HENT:  Head: Normocephalic and atraumatic.  Cardiovascular: Normal rate.   Respiratory: Effort normal.  Neurological: She is alert and oriented to person, place, and time.  Skin: Skin is warm and dry.  Psychiatric: She has a normal mood and affect. Her behavior is normal.    MAU Course  Procedures  MDM BPP 6/6 Initial NST  135/mod/+A/variable decelerations  Prolonged monitoring: 135/mod/+A/no decelerations  Assessment and Plan  Kathryne GinFaduma Pund is a 33 y.o. [id number] at 6714w5d who was kept in the MAU for prolonged monitoring after a reassuring BPP but NST with few shallow variables. Watched on the monitor for several hours and strip become category I making BPP 8/8 prior to discharge.   #SIUP at 5914w5d c/b polyhydramnios, diet controlled gestational diabetes, and prior IUFD at term - 8/8 BPP today which is reassuring - Again discussed induction at 39 weeks and patient is not interested in this. Will wait until induction date at 40 weeks.  - Strict return precautions given for FM/labor.  - Attend clinic appointment as scheduled.   William DaltonMcEachern, Ruhi Kopke 01/10/2014, 5:39 PM

## 2014-01-10 NOTE — MAU Note (Signed)
[employer] [facility] called to take pt home, pt verbalizes understanding to wait in the lobby until the taxi arrives

## 2014-01-10 NOTE — MAU Note (Signed)
Dr Ane PaymentMceachern in discussing plan of care and discharge home with interpreter on phone. Pt verbalizes understanding of teachings.

## 2014-01-10 NOTE — MAU Note (Signed)
Dr Ane PaymentMceachern in with pt discussing plan of care. Interpreter called ID # [id number]

## 2014-01-11 DIAGNOSIS — Z8632 Personal history of gestational diabetes: Secondary | ICD-10-CM | POA: Insufficient documentation

## 2014-01-12 ENCOUNTER — Inpatient Hospital Stay (HOSPITAL_COMMUNITY): Payer: Medicaid Other

## 2014-01-17 ENCOUNTER — Encounter: Payer: Self-pay | Admitting: Obstetrics and Gynecology

## 2014-01-17 ENCOUNTER — Ambulatory Visit (INDEPENDENT_AMBULATORY_CARE_PROVIDER_SITE_OTHER): Payer: Medicaid Other | Admitting: Obstetrics and Gynecology

## 2014-01-17 VITALS — BP 89/66 | HR 95 | Temp 98.6°F | Wt 201.0 lb

## 2014-01-17 DIAGNOSIS — O0943 Supervision of pregnancy with grand multiparity, third trimester: Secondary | ICD-10-CM

## 2014-01-17 DIAGNOSIS — O24419 Gestational diabetes mellitus in pregnancy, unspecified control: Secondary | ICD-10-CM | POA: Diagnosis not present

## 2014-01-17 DIAGNOSIS — O403XX1 Polyhydramnios, third trimester, fetus 1: Secondary | ICD-10-CM | POA: Diagnosis not present

## 2014-01-17 DIAGNOSIS — O0993 Supervision of high risk pregnancy, unspecified, third trimester: Secondary | ICD-10-CM

## 2014-01-17 DIAGNOSIS — O09293 Supervision of pregnancy with other poor reproductive or obstetric history, third trimester: Secondary | ICD-10-CM

## 2014-01-17 DIAGNOSIS — O24410 Gestational diabetes mellitus in pregnancy, diet controlled: Secondary | ICD-10-CM

## 2014-01-17 LAB — POCT URINALYSIS DIP (DEVICE)
BILIRUBIN URINE: NEGATIVE
Glucose, UA: NEGATIVE mg/dL
Hgb urine dipstick: NEGATIVE
KETONES UR: NEGATIVE mg/dL
Nitrite: NEGATIVE
Protein, ur: NEGATIVE mg/dL
Specific Gravity, Urine: 1.01 (ref 1.005–1.030)
Urobilinogen, UA: 0.2 mg/dL (ref 0.0–1.0)
pH: 6 (ref 5.0–8.0)

## 2014-01-17 NOTE — Progress Notes (Signed)
Patient is doing well without complaints. FM/labor precautions reviewed. Patient scheduled for IOL on 12/30 at 7:30 am. CBGs reviewed and all within range with the exception of her fasting this morning 123. Patient admits to eating rice in the middle of the night NST reviewed and reactive

## 2014-01-17 NOTE — Progress Notes (Signed)
NST reactive on 12/21.  Baseline 120.  Periods of prolonged accelerations.

## 2014-01-18 ENCOUNTER — Telehealth (HOSPITAL_COMMUNITY): Payer: Self-pay | Admitting: *Deleted

## 2014-01-18 NOTE — Telephone Encounter (Signed)
Preadmission screen Interpreter number [phone]

## 2014-01-19 ENCOUNTER — Inpatient Hospital Stay (HOSPITAL_COMMUNITY)
Admission: RE | Admit: 2014-01-19 | Discharge: 2014-01-24 | DRG: 765 | Disposition: A | Discharge status: Home / Self Care | Payer: Medicaid Other | Source: Ambulatory Visit | Attending: Obstetrics and Gynecology | Admitting: Obstetrics and Gynecology

## 2014-01-19 VITALS — BP 97/55 | HR 75 | Temp 98.1°F | Resp 19 | Ht 63.0 in | Wt 201.0 lb

## 2014-01-19 DIAGNOSIS — O09523 Supervision of elderly multigravida, third trimester: Secondary | ICD-10-CM | POA: Diagnosis not present

## 2014-01-19 DIAGNOSIS — O403XX0 Polyhydramnios, third trimester, not applicable or unspecified: Secondary | ICD-10-CM | POA: Diagnosis present

## 2014-01-19 DIAGNOSIS — O24429 Gestational diabetes mellitus in childbirth, unspecified control: Secondary | ICD-10-CM | POA: Diagnosis not present

## 2014-01-19 DIAGNOSIS — O480 Post-term pregnancy: Secondary | ICD-10-CM | POA: Diagnosis present

## 2014-01-19 DIAGNOSIS — O24419 Gestational diabetes mellitus in pregnancy, unspecified control: Principal | ICD-10-CM | POA: Diagnosis present

## 2014-01-19 DIAGNOSIS — Z3A40 40 weeks gestation of pregnancy: Secondary | ICD-10-CM | POA: Diagnosis present

## 2014-01-19 DIAGNOSIS — O1493 Unspecified pre-eclampsia, third trimester: Secondary | ICD-10-CM | POA: Diagnosis present

## 2014-01-19 DIAGNOSIS — Z302 Encounter for sterilization: Secondary | ICD-10-CM | POA: Diagnosis not present

## 2014-01-19 DIAGNOSIS — O0943 Supervision of pregnancy with grand multiparity, third trimester: Secondary | ICD-10-CM

## 2014-01-19 DIAGNOSIS — O09299 Supervision of pregnancy with other poor reproductive or obstetric history, unspecified trimester: Secondary | ICD-10-CM

## 2014-01-19 DIAGNOSIS — Z3483 Encounter for supervision of other normal pregnancy, third trimester: Secondary | ICD-10-CM | POA: Diagnosis present

## 2014-01-19 DIAGNOSIS — O0993 Supervision of high risk pregnancy, unspecified, third trimester: Secondary | ICD-10-CM

## 2014-01-19 DIAGNOSIS — Z8759 Personal history of other complications of pregnancy, childbirth and the puerperium: Secondary | ICD-10-CM

## 2014-01-19 LAB — GLUCOSE, CAPILLARY
GLUCOSE-CAPILLARY: 117 mg/dL — AB (ref 70–99)
Glucose-Capillary: 111 mg/dL — ABNORMAL HIGH (ref 70–99)
Glucose-Capillary: 83 mg/dL (ref 70–99)

## 2014-01-19 LAB — CBC
HEMATOCRIT: 33.1 % — AB (ref 36.0–46.0)
Hemoglobin: 11.2 g/dL — ABNORMAL LOW (ref 12.0–15.0)
MCH: 29.3 pg (ref 26.0–34.0)
MCHC: 33.8 g/dL (ref 30.0–36.0)
MCV: 86.6 fL (ref 78.0–100.0)
PLATELETS: 151 10*3/uL (ref 150–400)
RBC: 3.82 MIL/uL — AB (ref 3.87–5.11)
RDW: 14.6 % (ref 11.5–15.5)
WBC: 6.6 10*3/uL (ref 4.0–10.5)

## 2014-01-19 LAB — RPR

## 2014-01-19 LAB — ABO/RH: ABO/RH(D): O POS

## 2014-01-19 MED ORDER — LACTATED RINGERS IV SOLN
500.0000 mL | INTRAVENOUS | Status: DC | PRN
  Administered 2014-01-19: 300 mL via INTRAVENOUS

## 2014-01-19 MED ORDER — CITRIC ACID-SODIUM CITRATE 334-500 MG/5ML PO SOLN
30.0000 mL | ORAL | Status: DC | PRN
  Administered 2014-01-20 – 2014-01-21 (×2): 30 mL via ORAL
  Filled 2014-01-19 (×2): qty 15

## 2014-01-19 MED ORDER — OXYCODONE-ACETAMINOPHEN 5-325 MG PO TABS: 2.0000 | ORAL_TABLET | ORAL | Status: DC | PRN

## 2014-01-19 MED ORDER — ZOLPIDEM TARTRATE 5 MG PO TABS: 5.0000 mg | ORAL_TABLET | Freq: Every evening | ORAL | Status: DC | PRN

## 2014-01-19 MED ORDER — MISOPROSTOL 25 MCG QUARTER TABLET
50.0000 ug | ORAL_TABLET | ORAL | Status: DC
  Administered 2014-01-19 (×2): 50 ug via ORAL
  Filled 2014-01-19 (×2): qty 0.5

## 2014-01-19 MED ORDER — OXYCODONE-ACETAMINOPHEN 5-325 MG PO TABS: 1.0000 | ORAL_TABLET | ORAL | Status: DC | PRN

## 2014-01-19 MED ORDER — OXYTOCIN 40 UNITS IN LACTATED RINGERS INFUSION - SIMPLE MED
1.0000 m[IU]/min | INTRAVENOUS | Status: DC
  Administered 2014-01-19: 2 m[IU]/min via INTRAVENOUS

## 2014-01-19 MED ORDER — ONDANSETRON HCL 4 MG/2ML IJ SOLN
4.0000 mg | Freq: Four times a day (QID) | INTRAMUSCULAR | Status: DC | PRN
  Administered 2014-01-21: 4 mg via INTRAVENOUS
  Filled 2014-01-19: qty 2

## 2014-01-19 MED ORDER — OXYTOCIN 40 UNITS IN LACTATED RINGERS INFUSION - SIMPLE MED
1.0000 m[IU]/min | INTRAVENOUS | Status: DC
  Administered 2014-01-20: 8 m[IU]/min via INTRAVENOUS
  Administered 2014-01-21: 2 m[IU]/min via INTRAVENOUS
  Filled 2014-01-19: qty 1000

## 2014-01-19 MED ORDER — LACTATED RINGERS IV SOLN
INTRAVENOUS | Status: DC
  Administered 2014-01-19: 125 mL/h via INTRAVENOUS
  Administered 2014-01-19: 23:00:00 via INTRAVENOUS
  Administered 2014-01-21: 125 mL/h via INTRAVENOUS
  Administered 2014-01-21: 20:00:00 via INTRAVENOUS

## 2014-01-19 MED ORDER — LIDOCAINE HCL (PF) 1 % IJ SOLN
30.0000 mL | INTRAMUSCULAR | Status: DC | PRN
  Filled 2014-01-19: qty 30

## 2014-01-19 MED ORDER — OXYTOCIN BOLUS FROM INFUSION: 500.0000 mL | INTRAVENOUS | Status: DC

## 2014-01-19 MED ORDER — ACETAMINOPHEN 325 MG PO TABS: 650.0000 mg | ORAL_TABLET | ORAL | Status: DC | PRN

## 2014-01-19 MED ORDER — TERBUTALINE SULFATE 1 MG/ML IJ SOLN: 0.2500 mg | Freq: Once | INTRAMUSCULAR | Status: AC | PRN

## 2014-01-19 MED ORDER — OXYTOCIN 40 UNITS IN LACTATED RINGERS INFUSION - SIMPLE MED: 62.5000 mL/h | INTRAVENOUS | Status: DC

## 2014-01-19 NOTE — H&P (Signed)
LABOR ADMISSION HISTORY AND PHYSICAL  Tiffany Poole is a 33 y.o. female [phone] with IUP at 5242w0d by 4963w2d redating sono presenting for IOL 2/2 hx of term IUFD, polyhydramnios and A1DM. She declines an epidural for labor pain control. She plans on breast feeding. She request OCPs vs depo for birth control.   Dating: By 2963w2d redating sono --->  Estimated Date of Delivery: 01/19/14  Sono:    @[redacted]w[redacted]d , 7lb 10oz, 81% EFW   Prenatal History/Complications:  Past Medical History: Past Medical History  Diagnosis Date  . Medical history non-contributory   . Diabetes mellitus without complication 2015  . Gestational diabetes 2015    Past Surgical History: Past Surgical History  Procedure Laterality Date  . No past surgeries      Obstetrical History: OB History    Gravida Para Term Preterm AB TAB SAB Ectopic Multiple Living   12 11 11  0 0 0 0 0 0 9       Social History: History   Social History  . Marital Status: Married    Spouse Name: N/A    Number of Children: N/A  . Years of Education: N/A   Social History Main Topics  . Smoking status: Never Smoker   . Smokeless tobacco: Not on file  . Alcohol Use: No  . Drug Use: No  . Sexual Activity: Yes    Birth Control/ Protection: None   Other Topics Concern  . Not on file   Social History Narrative    Family History: Family History  Problem Relation Age of Onset  . Alcohol abuse Neg Hx   . Arthritis Neg Hx   . Asthma Neg Hx   . Birth defects Neg Hx   . Cancer Neg Hx   . COPD Neg Hx   . Depression Neg Hx   . Diabetes Neg Hx   . Drug abuse Neg Hx   . Early death Neg Hx   . Hearing loss Neg Hx   . Heart disease Neg Hx   . Hyperlipidemia Neg Hx   . Hypertension Neg Hx   . Kidney disease Neg Hx   . Learning disabilities Neg Hx   . Mental illness Neg Hx   . Mental retardation Neg Hx   . Miscarriages / Stillbirths Neg Hx   . Stroke Neg Hx   . Vision loss Neg Hx   . Varicose Veins Neg Hx     Allergies: No  Known Allergies  Prescriptions prior to admission  Medication Sig Dispense Refill Last Dose  . ACCU-CHEK FASTCLIX LANCETS MISC 1 each by Percutaneous route 4 (four) times daily. 100 each 12 Taking  . famotidine (PEPCID) 20 MG tablet Take 1 tablet (20 mg total) by mouth 2 (two) times daily. 60 tablet 3 Taking  . glucose blood (ACCU-CHEK SMARTVIEW) test strip DX Gestational Diabetes 648.83 for testing 4 times daily 100 each 12 Taking  . prenatal vitamin w/FE, FA (PRENATAL 1 + 1) 27-1 MG TABS tablet   10 Taking     Review of Systems   All systems reviewed and negative except as stated in HPI  Blood pressure 111/64, pulse 82, temperature 98 F (36.7 C), temperature source Oral, resp. rate 20, height 5\' 3"  (1.6 m), weight 201 lb (91.173 kg), last menstrual period 05/05/2013. General appearance: alert and cooperative Lungs: clear to auscultation bilaterally Heart: regular rate and rhythm Abdomen: soft, non-tender; bowel sounds normal Pelvic: adequate Extremities: Homans sign is negative, no sign of DVT Presentation: cephalic,  confirmed with sono  Dilation: 1.5 Effacement (%): Thick Station: Ballotable Exam by::  (foley in place)   Prenatal labs: ABO, Rh: --/--/O POS (12/30 0945) Antibody: NEG (12/30 0945) Rubella:   RPR: NON REAC (09/28 1003)  HBsAg: Negative (08/31 0000)  HIV: NONREACTIVE (09/28 1003)  GBS: Negative (12/03 0000)  1 hr Glucola 158 > 109/178/168/141 Genetic screening  Neg  Anatomy US poly   [facility]  Dating 24 week scan Blood type: O/Positive/-- (08/31 0000)  Genetic Screen Negative Quad Screen 09/20/13 Antibody:Negative (08/31 0000)  Anatomic US Normal, limited views of profile and RVOT Rubella: Immune (08/31 0000)  GTT GDM RPR: Nonreactive (08/31 0000)   TDaP vaccine 10/18/13 HBsAg: Negative (08/31 0000)   Flu vaccine 10/04/13 HIV: Non-reactive (08/31 0000)   GBS Negative GBS: negative  Contraception OCPs or Depo, declines BTS Pap:  Baby  Food Breast   Circumcision Yes, outpatient   [facility]   Support Person Husband       Results for orders placed or performed during the hospital encounter of 01/19/14 (from the past 24 hour(s))  CBC   Collection Time: 01/19/14  9:45 AM  Result Value Ref Range   WBC 6.6 4.0 - 10.5 K/uL   RBC 3.82 (L) 3.87 - 5.11 MIL/uL   Hemoglobin 11.2 (L) 12.0 - 15.0 g/dL   HCT 16.133.1 (L) 09.636.0 - 04.546.0 %   MCV 86.6 78.0 - 100.0 fL   MCH 29.3 26.0 - 34.0 pg   MCHC 33.8 30.0 - 36.0 g/dL   RDW 40.914.6 81.111.5 - 91.415.5 %   Platelets 151 150 - 400 K/uL  Type and screen   Collection Time: 01/19/14  9:45 AM  Result Value Ref Range   ABO/RH(D) O POS    Antibody Screen NEG    Sample Expiration 01/22/2014   Glucose, capillary   Collection Time: 01/19/14 11:28 AM  Result Value Ref Range   Glucose-Capillary 117 (H) 70 - 99 mg/dL    Patient Active Problem List   Diagnosis Date Noted  . History of intrauterine death 01/19/2014  . GDM (gestational diabetes mellitus)   . [redacted] weeks gestation of pregnancy   . Polyhydramnios   . Polyhydramnios in third trimester, antepartum complication   . Grand multiparity, antepartum 10/18/2013  . Pregnancy, high-risk 10/04/2013  . Gestational diabetes mellitus 10/04/2013  . Prior pregnancy with fetal demise at term 10/04/2013    Assessment: Tiffany Poole is a 33 y.o. [id number] at 7366w0d here forIOL 2/2 A1DM, poly, hx of IUFD at term   #Labor:FB placed, oral cytotec as well #Pain: IV fentanyl prn #FWB: Cat I #ID:  GBS neg #MOF: breast #MOC:OCPs vs depo CBGs q4h, if elevated then q2h in  Active labor  Fender Herder ROCIO 01/19/2014, 1:06 PM

## 2014-01-19 NOTE — Progress Notes (Signed)
Fetal movement palpated. 

## 2014-01-20 ENCOUNTER — Encounter (HOSPITAL_COMMUNITY): Payer: Self-pay

## 2014-01-20 LAB — GLUCOSE, CAPILLARY
GLUCOSE-CAPILLARY: 109 mg/dL — AB (ref 70–99)
GLUCOSE-CAPILLARY: 91 mg/dL (ref 70–99)
Glucose-Capillary: 111 mg/dL — ABNORMAL HIGH (ref 70–99)
Glucose-Capillary: 127 mg/dL — ABNORMAL HIGH (ref 70–99)
Glucose-Capillary: 87 mg/dL (ref 70–99)
Glucose-Capillary: 90 mg/dL (ref 70–99)

## 2014-01-20 MED ORDER — MISOPROSTOL 25 MCG QUARTER TABLET
50.0000 ug | ORAL_TABLET | Freq: Once | ORAL | Status: AC
  Administered 2014-01-20: 50 ug via ORAL
  Filled 2014-01-20: qty 1

## 2014-01-20 MED ORDER — FENTANYL CITRATE 0.05 MG/ML IJ SOLN
100.0000 ug | INTRAMUSCULAR | Status: DC | PRN
  Administered 2014-01-20 – 2014-01-21 (×6): 100 ug via INTRAVENOUS
  Filled 2014-01-20 (×6): qty 2

## 2014-01-21 ENCOUNTER — Encounter (HOSPITAL_COMMUNITY)
Admission: RE | Disposition: A | Discharge status: Home / Self Care | Payer: Self-pay | Source: Ambulatory Visit | Attending: Obstetrics and Gynecology

## 2014-01-21 ENCOUNTER — Inpatient Hospital Stay (HOSPITAL_COMMUNITY): Payer: Medicaid Other | Admitting: Certified Registered"

## 2014-01-21 ENCOUNTER — Encounter (HOSPITAL_COMMUNITY): Payer: Self-pay

## 2014-01-21 DIAGNOSIS — O24429 Gestational diabetes mellitus in childbirth, unspecified control: Secondary | ICD-10-CM

## 2014-01-21 DIAGNOSIS — Z302 Encounter for sterilization: Secondary | ICD-10-CM

## 2014-01-21 DIAGNOSIS — Z3A40 40 weeks gestation of pregnancy: Secondary | ICD-10-CM

## 2014-01-21 HISTORY — PX: TUBAL LIGATION: SHX77

## 2014-01-21 LAB — GLUCOSE, CAPILLARY
GLUCOSE-CAPILLARY: 122 mg/dL — AB (ref 70–99)
GLUCOSE-CAPILLARY: 111 mg/dL — AB (ref 70–99)
Glucose-Capillary: 106 mg/dL — ABNORMAL HIGH (ref 70–99)

## 2014-01-21 LAB — PREPARE RBC (CROSSMATCH)

## 2014-01-21 SURGERY — Surgical Case
Anesthesia: Spinal | Site: Abdomen

## 2014-01-21 MED ORDER — HYDROMORPHONE HCL 1 MG/ML IJ SOLN
0.2500 mg | INTRAMUSCULAR | Status: DC | PRN
Start: 2014-01-21 — End: 2014-01-22

## 2014-01-21 MED ORDER — MEPERIDINE HCL 25 MG/ML IJ SOLN
INTRAMUSCULAR | Status: DC | PRN
  Administered 2014-01-21 (×2): 12.5 mg via INTRAVENOUS

## 2014-01-21 MED ORDER — SCOPOLAMINE 1 MG/3DAYS TD PT72
1.0000 | MEDICATED_PATCH | Freq: Once | TRANSDERMAL | Status: AC
  Administered 2014-01-21: 1.5 mg via TRANSDERMAL

## 2014-01-21 MED ORDER — MEPERIDINE HCL 25 MG/ML IJ SOLN: 6.2500 mg | INTRAMUSCULAR | Status: DC | PRN

## 2014-01-21 MED ORDER — FENTANYL CITRATE 0.05 MG/ML IJ SOLN
INTRAMUSCULAR | Status: AC
  Filled 2014-01-21: qty 2

## 2014-01-21 MED ORDER — KETOROLAC TROMETHAMINE 30 MG/ML IJ SOLN: 30.0000 mg | Freq: Four times a day (QID) | INTRAMUSCULAR | Status: DC | PRN

## 2014-01-21 MED ORDER — MEPERIDINE HCL 25 MG/ML IJ SOLN
INTRAMUSCULAR | Status: AC
  Filled 2014-01-21: qty 1

## 2014-01-21 MED ORDER — MORPHINE SULFATE (PF) 0.5 MG/ML IJ SOLN
INTRAMUSCULAR | Status: DC | PRN
  Administered 2014-01-21: .2 mg via INTRATHECAL

## 2014-01-21 MED ORDER — CEFAZOLIN SODIUM-DEXTROSE 2-3 GM-% IV SOLR
INTRAVENOUS | Status: DC | PRN
  Administered 2014-01-21: 2 g via INTRAVENOUS

## 2014-01-21 MED ORDER — MISOPROSTOL 200 MCG PO TABS
ORAL_TABLET | ORAL | Status: AC
  Filled 2014-01-21: qty 4

## 2014-01-21 MED ORDER — CEFAZOLIN SODIUM-DEXTROSE 2-3 GM-% IV SOLR
INTRAVENOUS | Status: AC
  Filled 2014-01-21: qty 50

## 2014-01-21 MED ORDER — ONDANSETRON HCL 4 MG/2ML IJ SOLN
INTRAMUSCULAR | Status: DC | PRN
  Administered 2014-01-21: 4 mg via INTRAVENOUS

## 2014-01-21 MED ORDER — LACTATED RINGERS IV SOLN
INTRAVENOUS | Status: DC | PRN
  Administered 2014-01-21: 20:00:00 via INTRAVENOUS

## 2014-01-21 MED ORDER — OXYTOCIN 10 UNIT/ML IJ SOLN
40.0000 [IU] | INTRAMUSCULAR | Status: DC | PRN
  Administered 2014-01-21: 40 [IU] via INTRAVENOUS

## 2014-01-21 MED ORDER — MORPHINE SULFATE 0.5 MG/ML IJ SOLN
INTRAMUSCULAR | Status: AC
  Filled 2014-01-21: qty 10

## 2014-01-21 MED ORDER — LACTATED RINGERS IV SOLN
INTRAVENOUS | Status: DC
  Administered 2014-01-21: 18:00:00 via INTRAUTERINE

## 2014-01-21 MED ORDER — OXYTOCIN 10 UNIT/ML IJ SOLN
INTRAMUSCULAR | Status: AC
  Filled 2014-01-21: qty 4

## 2014-01-21 MED ORDER — BUPIVACAINE IN DEXTROSE 0.75-8.25 % IT SOLN
INTRATHECAL | Status: DC | PRN
  Administered 2014-01-21: 1.4 mL via INTRATHECAL

## 2014-01-21 MED ORDER — PROMETHAZINE HCL 25 MG/ML IJ SOLN: 6.2500 mg | INTRAMUSCULAR | Status: DC | PRN

## 2014-01-21 MED ORDER — ONDANSETRON HCL 4 MG/2ML IJ SOLN
INTRAMUSCULAR | Status: AC
Start: 2014-01-21 — End: 2014-01-21
  Filled 2014-01-21: qty 2

## 2014-01-21 MED ORDER — SCOPOLAMINE 1 MG/3DAYS TD PT72
MEDICATED_PATCH | TRANSDERMAL | Status: AC
  Filled 2014-01-21: qty 1

## 2014-01-21 MED ORDER — KETOROLAC TROMETHAMINE 30 MG/ML IJ SOLN
30.0000 mg | Freq: Four times a day (QID) | INTRAMUSCULAR | Status: DC | PRN
Start: 2014-01-21 — End: 2014-01-22
  Administered 2014-01-22: 30 mg via INTRAVENOUS

## 2014-01-21 MED ORDER — FENTANYL CITRATE 0.05 MG/ML IJ SOLN
INTRAMUSCULAR | Status: DC | PRN
  Administered 2014-01-21: 50 ug via INTRAVENOUS
  Administered 2014-01-21: 12.5 ug via INTRATHECAL
  Administered 2014-01-21: 37.5 ug via INTRAVENOUS

## 2014-01-21 MED ORDER — MEPERIDINE HCL 25 MG/ML IJ SOLN
6.2500 mg | INTRAMUSCULAR | Status: DC | PRN
  Administered 2014-01-21 (×2): 6.25 mg via INTRAVENOUS

## 2014-01-21 MED ORDER — PHENYLEPHRINE 8 MG IN D5W 100 ML (0.08MG/ML) PREMIX OPTIME
INJECTION | INTRAVENOUS | Status: DC | PRN
  Administered 2014-01-21: 60 ug/min via INTRAVENOUS

## 2014-01-21 MED ORDER — PHENYLEPHRINE 8 MG IN D5W 100 ML (0.08MG/ML) PREMIX OPTIME
INJECTION | INTRAVENOUS | Status: AC
  Filled 2014-01-21: qty 100

## 2014-01-21 SURGICAL SUPPLY — 36 items
BENZOIN TINCTURE PRP APPL 2/3 (GAUZE/BANDAGES/DRESSINGS) IMPLANT
CLAMP CORD UMBIL (MISCELLANEOUS) ×4 IMPLANT
CLIP FILSHIE TUBAL LIGA STRL (Clip) ×8 IMPLANT
CLOSURE WOUND 1/2 X4 (GAUZE/BANDAGES/DRESSINGS)
CLOTH BEACON ORANGE TIMEOUT ST (SAFETY) ×4 IMPLANT
DRAPE SHEET LG 3/4 BI-LAMINATE (DRAPES) ×4 IMPLANT
DRSG OPSITE POSTOP 4X10 (GAUZE/BANDAGES/DRESSINGS) ×4 IMPLANT
DURAPREP 26ML APPLICATOR (WOUND CARE) ×4 IMPLANT
ELECT REM PT RETURN 9FT ADLT (ELECTROSURGICAL) ×4
ELECTRODE REM PT RTRN 9FT ADLT (ELECTROSURGICAL) ×2 IMPLANT
EXTRACTOR VACUUM KIWI (MISCELLANEOUS) IMPLANT
GLOVE BIO SURGEON ST LM GN SZ9 (GLOVE) ×4 IMPLANT
GLOVE BIOGEL PI IND STRL 9 (GLOVE) ×2 IMPLANT
GLOVE BIOGEL PI INDICATOR 9 (GLOVE) ×2
GOWN STRL REUS W/TWL 2XL LVL3 (GOWN DISPOSABLE) ×4 IMPLANT
GOWN STRL REUS W/TWL LRG LVL3 (GOWN DISPOSABLE) ×4 IMPLANT
NEEDLE HYPO 25X5/8 SAFETYGLIDE (NEEDLE) IMPLANT
NS IRRIG 1000ML POUR BTL (IV SOLUTION) ×4 IMPLANT
PACK C SECTION WH (CUSTOM PROCEDURE TRAY) ×4 IMPLANT
PAD OB MATERNITY 4.3X12.25 (PERSONAL CARE ITEMS) ×4 IMPLANT
RTRCTR C-SECT PINK 25CM LRG (MISCELLANEOUS) IMPLANT
RTRCTR C-SECT PINK 34CM XLRG (MISCELLANEOUS) IMPLANT
SPONGE LAP 18X18 X RAY DECT (DISPOSABLE) ×4 IMPLANT
STRIP CLOSURE SKIN 1/2X4 (GAUZE/BANDAGES/DRESSINGS) IMPLANT
SUT MNCRL 0 VIOLET CTX 36 (SUTURE) ×4 IMPLANT
SUT MONOCRYL 0 CTX 36 (SUTURE) ×4
SUT PLAIN 2 0 XLH (SUTURE) ×4 IMPLANT
SUT VIC AB 0 CT1 27 (SUTURE) ×2
SUT VIC AB 0 CT1 27XBRD ANBCTR (SUTURE) ×2 IMPLANT
SUT VIC AB 2-0 CT1 27 (SUTURE) ×4
SUT VIC AB 2-0 CT1 TAPERPNT 27 (SUTURE) ×4 IMPLANT
SUT VIC AB 4-0 KS 27 (SUTURE) ×4 IMPLANT
SYR BULB IRRIGATION 50ML (SYRINGE) IMPLANT
TOWEL OR 17X24 6PK STRL BLUE (TOWEL DISPOSABLE) ×4 IMPLANT
TRAY FOLEY CATH 14FR (SET/KITS/TRAYS/PACK) ×4 IMPLANT
WATER STERILE IRR 1000ML POUR (IV SOLUTION) ×4 IMPLANT

## 2014-01-21 NOTE — OR Nursing (Signed)
Pt from [place]. Speaks no English. Used [employer] translation line at [phone] with Magazine features editor. CFNewnam, RN

## 2014-01-21 NOTE — Lactation Note (Signed)
This note was copied from the chart of Tiffany Poole. Lactation Consultation Note  Patient Name: Tiffany Anusha Claus RUEAV'W Date: 01/21/2014 Reason for consult: Initial assessment;Other (Comment) (called to PACU) to assist with latching this newborn.  Mom is sedated after a C/S delivery of her 12th child.  Her female friend/interpreter and husband are present to translate into [place].  Baby has been tongue-thrusting and only briefly latching.  LC arrived to see him asleep and STS but he is quickly stimulated to open his mouth for latching after some colostrum expressed on mom's everted nipple.  He requires several re-latches before finally sustaining latch for about 5 minutes with rhythmical sucking bursts and intermittent swallows.  Mom opens eyes from time to time and interpreter explains hand expression and signs of proper latch to mom.  LC provided [employer] brochure and reviewed [facility] services and list of community and web site resources.    Maternal Data Formula Feeding for Exclusion: No Has patient been taught Hand Expression?: Yes (LC demonstrated and after several breast compressions, drops seen) Does the patient have breastfeeding experience prior to this delivery?: Yes  Feeding Feeding Type: Breast Fed Length of feed: 14 min  LATCH Score/Interventions Latch: Repeated attempts needed to sustain latch, nipple held in mouth throughout feeding, stimulation needed to elicit sucking reflex. Intervention(s): Adjust position;Assist with latch;Breast compression  Audible Swallowing: Spontaneous and intermittent  Type of Nipple: Everted at rest and after stimulation  Comfort (Breast/Nipple): Soft / non-tender     Hold (Positioning): Assistance needed to correctly position infant at breast and maintain latch. Intervention(s): Support Pillows;Skin to skin  LATCH Score: 8 (LC assisted and observed in PACU)  Lactation Tools Discussed/Used   STS, hand expression, signs of proper  latch  Consult Status Consult Status: Follow-up Date: 01/22/14 Follow-up type: In-patient    Warrick Parisian [facility] 01/21/2014, 10:14 PM

## 2014-01-21 NOTE — Progress Notes (Signed)
Tiffany Poole is a 34 y.o. [id number] at [redacted]w[redacted]d by LMP admitted for induction of labor due to Gestational diabetes and prior FDIU.  Subjective:  Pt interviewed thru [employer]. Hx spont vag del of "6 Kilo " infant. Objective: BP 114/73 mmHg  Pulse 69  Temp(Src) 98.1 F (36.7 C) (Oral)  Resp 18  Ht  (1.6 m)  Wt 91.173 kg (201 lb)  BMI 35.61 kg/m2  LMP 05/05/2013  EFW by Thayer Ohm 4 kilo or less.    FHT:  FHR: 145 bpm, variability: moderate,  accelerations:  Present,  decelerations:  Absent UC:   irregular, every 6-7 minutes SVE:   Dilation: 7 Effacement (%): 80 Station: -2 Exam by:: Tiffany Busing RN  Labs: Lab Results  Component Value Date   WBC 6.6 01/19/2014   HGB 11.2* 01/19/2014   HCT 33.1* 01/19/2014   MCV 86.6 01/19/2014   PLT 151 01/19/2014    Assessment / Plan: Induction of labor due to hx Fdiu prior pregnancy, also DMA2,  progressing well on pitocin  Labor: Progressing normally and s/p brief break from Pitocin now restarted now at 16 mU/min Preeclampsia:   Fetal Wellbeing:  Category I Pain Control:  Fentanyl I/D:  n/a Anticipated MOD:  NSVD  Tiffany Poole V 01/21/2014, 9:59 AM

## 2014-01-21 NOTE — Anesthesia Procedure Notes (Signed)
Spinal Patient location during procedure: OR Start time: 01/21/2014 7:55 PM End time: 01/21/2014 7:58 PM Staffing Anesthesiologist: Leilani Able Performed by: anesthesiologist  Preanesthetic Checklist Completed: patient identified, surgical consent, pre-op evaluation, timeout performed, IV checked, risks and benefits discussed and monitors and equipment checked Spinal Block Patient position: sitting Prep: DuraPrep Patient monitoring: heart rate, cardiac monitor, continuous pulse ox and blood pressure Approach: midline Location: L3-4 Injection technique: single-shot Needle Needle type: Pencan  Needle gauge: 24 G Needle length: 9 cm Needle insertion depth: 4 cm Assessment Sensory level: T4

## 2014-01-21 NOTE — Brief Op Note (Signed)
01/19/2014 - 01/21/2014  9:05 PM  PATIENT:  Tiffany Poole  34 y.o. female  PRE-OPERATIVE DIAGNOSIS:  Failure to progress pregnancy 40 weeks failed induction of labor desire for permanent sterilization  POST-OPERATIVE DIAGNOSIS:  failure to progress same, delivered  PROCEDURE:  Procedure(s): CESAREAN SECTION (N/A) BILATERAL TUBAL LIGATION primary low transverse cesarean section and bilateral Filshie clip placement  SURGEON:  Surgeon(s) and Role:    * Tilda Burrow, MD - Primary  PHYSICIAN ASSISTANT:   ASSISTANTS: none   ANESTHESIA:   spinal  EBL:  Total I/O In: 2000 [I.V.:2000] Out: 1100 [Urine:300; Blood:800]  BLOOD ADMINISTERED:none  DRAINS: Urinary Catheter (Foley)   LOCAL MEDICATIONS USED:  NONE  SPECIMEN:  Source of Specimen:  Placenta to labor and delivery  DISPOSITION OF SPECIMEN:  N/A  COUNTS:  YES  TOURNIQUET:  * No tourniquets in log *  DICTATION: .Dragon Dictation  PLAN OF CARE: Patient has inpatient orders  PATIENT DISPOSITION:  PACU - hemodynamically stable.   Delay start of Pharmacological VTE agent (>24hrs) due to surgical blood loss or risk of bleeding: not applicable

## 2014-01-21 NOTE — Op Note (Signed)
01/19/2014 - 01/21/2014  9:05 PM  PATIENT:  Tiffany Poole  34 y.o. female  PRE-OPERATIVE DIAGNOSIS:  Failure to progress pregnancy 40 weeks failed induction of labor desire for permanent sterilization  POST-OPERATIVE DIAGNOSIS:  failure to progress same, delivered  PROCEDURE:  Procedure(s): CESAREAN SECTION (N/A) BILATERAL TUBAL LIGATION primary low transverse cesarean section and bilateral Filshie clip placement  SURGEON:  Surgeon(s) and Role:    * Tilda Burrow, MD - Primary  PHYSICIAN ASSISTANT:   ASSISTANTS: none   ANESTHESIA:   spinal  EBL:  Total I/O In: 2000 [I.V.:2000] Out: 1100 [Urine:300; Blood:800]  BLOOD ADMINISTERED:none  DRAINS: Urinary Catheter (Foley)   LOCAL MEDICATIONS USED:  NONE  SPECIMEN:  Source of Specimen:  Placenta to labor and delivery  DISPOSITION OF SPECIMEN:  N/A  COUNTS:  YES  TOURNIQUET:  * No tourniquets in log *  DICTATION: .Dragon Dictation  PLAN OF CARE: Patient has inpatient orders  PATIENT DISPOSITION:  PACU - hemodynamically stable.   Delay start of Pharmacological VTE agent (>24hrs) due to surgical blood loss or risk of bleeding: not applicable Details of procedure: Patient was taken operating room, prepped and draped for lower abdominal surgery with transverse lower abdominal incision repeated in standard method dissection to the fascia which was opened and I transverse fashion peritoneum opened in the midline, bladder flap developed and transverse uterine incision made at the level of the fetal ear fetal vertex was delivered into the incision and delivered with fundal pressure. The baby was in an occiput transverse position with the fetal spine on the patient's right side, having changed from when ultrasound was performed earlier today the fetal vertex was asynclitic. Infant was passed to the waiting pediatrician in good condition. An air-fluid was clear without malodor. Placenta delivered in response to Pitocin and Cred uterine  massage, with membrane remnants removed from the fundus of the uterus. The lower uterine segment was very elongated and floppy. Uterus was irrigated swabbed out, closed with running locking 0 Monocryl first layer in continuous running 0 Monocryl second layer. There was tendency to ooze which required bladder flap to be closed stomach in the center point there was a couple locking sutures placed incorporating the surface of the lower uterine segment as well which resulted in good hemostasis Tubal ligation. Fallopian tubes could be identified and Filshie clip was placed on the midportion of each tube with identification of the tube to its fimbriated end. Abdomen was irrigated out, anterior peritoneum closed using running 2-0 Vicryl. The fascia was closed with running 0 Vicryl, subcutaneous tissues were irrigated reapproximated with interrupted 20 plain, then subcuticular 4-0 Vicryl skin closure completed the procedure EBL was 800 cc

## 2014-01-21 NOTE — Anesthesia Postprocedure Evaluation (Signed)
Anesthesia Post Note  Patient: Tiffany Poole  Procedure(s) Performed: Procedure(s) (LRB): CESAREAN SECTION (N/A) BILATERAL TUBAL LIGATION  Anesthesia type: Spinal  Patient location: PACU  Post pain: Pain level controlled  Post assessment: Post-op Vital signs reviewed  Last Vitals:  Filed Vitals:   01/21/14 2245  BP: 105/60  Pulse: 81  Temp:   Resp: 23    Post vital signs: Reviewed  Level of consciousness: awake  Complications: No apparent anesthesia complications

## 2014-01-21 NOTE — Progress Notes (Signed)
Tiffany Poole is a 34 y.o. [id number] at [redacted]w[redacted]d admitted for induction of labor due to Gestational diabetes and hx IUFD who declined IOL at 39wk. Pt is [place] . Interpreter is used..  Subjective:   Objective: BP 111/64 mmHg  Pulse 72  Temp(Src) 98.1 F (36.7 C) (Oral)  Resp 20  Ht  (1.6 m)  Wt 91.173 kg (201 lb)  BMI 35.61 kg/m2  LMP 05/05/2013      FHT:  FHR: 160 bpm, variability: moderate,  accelerations:  Present,  decelerations:  Absent UC:   irregular, every 5-7 minutes SVE:   Dilation: 5.5 Effacement (%): 70 Station: -2 Exam by:: L Lamon RN U/s is done to identify fetal position. The baby's spine is on pt right,.   Labs: Lab Results  Component Value Date   WBC 6.6 01/19/2014   HGB 11.2* 01/19/2014   HCT 33.1* 01/19/2014   MCV 86.6 01/19/2014   PLT 151 01/19/2014    Assessment / Plan: Induction of labor due to gestational diabetes and hx prior IUFD at term,  progressing well on pitocin  Labor: slow progress, contractions suboptimal , will continue to increase  Pitocin Preeclampsia:   Fetal Wellbeing:  Category I Pain Control:  Labor support without medications I/D:  n/a Anticipated MOD:  NSVD  Tiffany Poole V 01/21/2014, 1:38 PM

## 2014-01-21 NOTE — Transfer of Care (Signed)
Immediate Anesthesia Transfer of Care Note  Patient: Tiffany Poole  Procedure(s) Performed: Procedure(s): CESAREAN SECTION (N/A) BILATERAL TUBAL LIGATION  Patient Location: PACU  Anesthesia Type:Spinal  Level of Consciousness: awake, alert  and oriented  Airway & Oxygen Therapy: Patient Spontanous Breathing  Post-op Assessment: Report given to PACU RN and Post -op Vital signs reviewed and stable  Post vital signs: Reviewed and stable  Complications: No apparent anesthesia complications

## 2014-01-21 NOTE — Progress Notes (Signed)
Dr Emelda Fear explained risks and benefits of c-section for arrest of dilation.  Pt verbalized understanding and signed consent.  [employer] interpreter used for discussion.

## 2014-01-21 NOTE — Progress Notes (Signed)
Clayton Jarmon is a 34 y.o. [id number] at [redacted]w[redacted]d  admitted for induction of labor due to Gestational diabetes and hx iufd.  Subjective: The patient has made no progress in 12 hours, and over the last 4 hours has had adequate MVU's by IUPC.   Objective: BP 115/59 mmHg  Pulse 76  Temp(Src) 98.5 F (36.9 C) (Oral)  Resp 20  Ht  (1.6 m)  Wt 91.173 kg (201 lb)  BMI 35.61 kg/m2  LMP 05/05/2013      FHT:  FHR: 155 bpm, variability: moderate,  accelerations:  Present,  decelerations:  Present variables UC:   regular, every 4 minutes SVE:   Dilation: 8 Effacement (%): 80 Station: -3 Exam by:: Dr Emelda Fear  Labs: Lab Results  Component Value Date   WBC 6.6 01/19/2014   HGB 11.2* 01/19/2014   HCT 33.1* 01/19/2014   MCV 86.6 01/19/2014   PLT 151 01/19/2014    Assessment / Plan: Arrest of decent  Labor: despite good labor x 4 + hours, pt is not making any progress. Preeclampsia:   Fetal Wellbeing:  Act I Pain Control:  Labor support without medications I/D:  n/a Anticipated MOD:  cesarean. due to failure to progress  Elazar Argabright V 01/21/2014, 7:36 PM

## 2014-01-21 NOTE — Anesthesia Preprocedure Evaluation (Signed)
Anesthesia Evaluation  Patient identified by MRN, date of birth, ID band Patient awake    Reviewed: Allergy & Precautions, H&P , NPO status , Patient's Chart, lab work & pertinent test results  Airway Mallampati: II  TM Distance: >3 FB Neck ROM: full    Dental no notable dental hx.    Pulmonary neg pulmonary ROS,    Pulmonary exam normal       Cardiovascular negative cardio ROS      Neuro/Psych negative neurological ROS  negative psych ROS   GI/Hepatic negative GI ROS, Neg liver ROS,   Endo/Other  diabetes, Gestational  Renal/GU negative Renal ROS     Musculoskeletal   Abdominal (+) + obese,   Peds  Hematology negative hematology ROS (+)   Anesthesia Other Findings   Reproductive/Obstetrics (+) Pregnancy                             Anesthesia Physical Anesthesia Plan  ASA: III and emergent  Anesthesia Plan: Spinal   Post-op Pain Management:    Induction:   Airway Management Planned:   Additional Equipment:   Intra-op Plan:   Post-operative Plan:   Informed Consent: I have reviewed the patients History and Physical, chart, labs and discussed the procedure including the risks, benefits and alternatives for the proposed anesthesia with the patient or authorized representative who has indicated his/her understanding and acceptance.     Plan Discussed with: CRNA and Surgeon  Anesthesia Plan Comments:         Anesthesia Quick Evaluation

## 2014-01-22 LAB — TYPE AND SCREEN
ABO/RH(D): O POS
ANTIBODY SCREEN: NEGATIVE
UNIT DIVISION: 0
Unit division: 0

## 2014-01-22 LAB — GLUCOSE, CAPILLARY
GLUCOSE-CAPILLARY: 154 mg/dL — AB (ref 70–99)
Glucose-Capillary: 125 mg/dL — ABNORMAL HIGH (ref 70–99)

## 2014-01-22 LAB — CBC
HEMATOCRIT: 26.5 % — AB (ref 36.0–46.0)
HEMOGLOBIN: 8.8 g/dL — AB (ref 12.0–15.0)
MCH: 29.2 pg (ref 26.0–34.0)
MCHC: 33.2 g/dL (ref 30.0–36.0)
MCV: 88 fL (ref 78.0–100.0)
PLATELETS: 135 10*3/uL — AB (ref 150–400)
RBC: 3.01 MIL/uL — ABNORMAL LOW (ref 3.87–5.11)
RDW: 14.7 % (ref 11.5–15.5)
WBC: 12.6 10*3/uL — AB (ref 4.0–10.5)

## 2014-01-22 MED ORDER — ONDANSETRON HCL 4 MG/2ML IJ SOLN: 4.0000 mg | INTRAMUSCULAR | Status: DC | PRN

## 2014-01-22 MED ORDER — MENTHOL 3 MG MT LOZG: 1.0000 | LOZENGE | OROMUCOSAL | Status: DC | PRN

## 2014-01-22 MED ORDER — DIPHENHYDRAMINE HCL 50 MG/ML IJ SOLN: 12.5000 mg | INTRAMUSCULAR | Status: DC | PRN

## 2014-01-22 MED ORDER — NALOXONE HCL 0.4 MG/ML IJ SOLN: 0.4000 mg | INTRAMUSCULAR | Status: DC | PRN

## 2014-01-22 MED ORDER — IBUPROFEN 600 MG PO TABS: 600.0000 mg | ORAL_TABLET | Freq: Four times a day (QID) | ORAL | Status: DC | PRN

## 2014-01-22 MED ORDER — NALBUPHINE HCL 10 MG/ML IJ SOLN: 5.0000 mg | INTRAMUSCULAR | Status: DC | PRN

## 2014-01-22 MED ORDER — LACTATED RINGERS IV SOLN
INTRAVENOUS | Status: DC
  Administered 2014-01-22: 07:00:00 via INTRAVENOUS

## 2014-01-22 MED ORDER — ZOLPIDEM TARTRATE 5 MG PO TABS: 5.0000 mg | ORAL_TABLET | Freq: Every evening | ORAL | Status: DC | PRN

## 2014-01-22 MED ORDER — OXYCODONE-ACETAMINOPHEN 5-325 MG PO TABS
1.0000 | ORAL_TABLET | ORAL | Status: DC | PRN
  Administered 2014-01-22: 1 via ORAL
  Filled 2014-01-22: qty 1

## 2014-01-22 MED ORDER — LANOLIN HYDROUS EX OINT: 1.0000 "application " | TOPICAL_OINTMENT | CUTANEOUS | Status: DC | PRN

## 2014-01-22 MED ORDER — ONDANSETRON HCL 4 MG PO TABS: 4.0000 mg | ORAL_TABLET | ORAL | Status: DC | PRN

## 2014-01-22 MED ORDER — NALBUPHINE HCL 10 MG/ML IJ SOLN: 5.0000 mg | Freq: Once | INTRAMUSCULAR | Status: AC | PRN

## 2014-01-22 MED ORDER — DIBUCAINE 1 % RE OINT: 1.0000 "application " | TOPICAL_OINTMENT | RECTAL | Status: DC | PRN

## 2014-01-22 MED ORDER — NALOXONE HCL 1 MG/ML IJ SOLN
1.0000 ug/kg/h | INTRAMUSCULAR | Status: DC | PRN
  Filled 2014-01-22: qty 2

## 2014-01-22 MED ORDER — WITCH HAZEL-GLYCERIN EX PADS: 1.0000 "application " | MEDICATED_PAD | CUTANEOUS | Status: DC | PRN

## 2014-01-22 MED ORDER — PRENATAL MULTIVITAMIN CH
1.0000 | ORAL_TABLET | Freq: Every day | ORAL | Status: DC
  Administered 2014-01-22 – 2014-01-24 (×3): 1 via ORAL
  Filled 2014-01-22 (×3): qty 1

## 2014-01-22 MED ORDER — DIPHENHYDRAMINE HCL 25 MG PO CAPS: 25.0000 mg | ORAL_CAPSULE | ORAL | Status: DC | PRN

## 2014-01-22 MED ORDER — IBUPROFEN 600 MG PO TABS
600.0000 mg | ORAL_TABLET | Freq: Four times a day (QID) | ORAL | Status: DC
  Administered 2014-01-22 – 2014-01-24 (×11): 600 mg via ORAL
  Filled 2014-01-22 (×11): qty 1

## 2014-01-22 MED ORDER — SIMETHICONE 80 MG PO CHEW
80.0000 mg | CHEWABLE_TABLET | ORAL | Status: DC
  Administered 2014-01-23 – 2014-01-24 (×2): 80 mg via ORAL
  Filled 2014-01-22 (×2): qty 1

## 2014-01-22 MED ORDER — SENNOSIDES-DOCUSATE SODIUM 8.6-50 MG PO TABS
2.0000 | ORAL_TABLET | ORAL | Status: DC
  Administered 2014-01-23 – 2014-01-24 (×2): 2 via ORAL
  Filled 2014-01-22 (×2): qty 2

## 2014-01-22 MED ORDER — ONDANSETRON HCL 4 MG/2ML IJ SOLN: 4.0000 mg | Freq: Three times a day (TID) | INTRAMUSCULAR | Status: DC | PRN

## 2014-01-22 MED ORDER — FAMOTIDINE 20 MG PO TABS
20.0000 mg | ORAL_TABLET | Freq: Two times a day (BID) | ORAL | Status: DC
  Administered 2014-01-22 – 2014-01-24 (×5): 20 mg via ORAL
  Filled 2014-01-22 (×5): qty 1

## 2014-01-22 MED ORDER — SODIUM CHLORIDE 0.9 % IJ SOLN: 3.0000 mL | INTRAMUSCULAR | Status: DC | PRN

## 2014-01-22 MED ORDER — OXYCODONE-ACETAMINOPHEN 5-325 MG PO TABS: 2.0000 | ORAL_TABLET | ORAL | Status: DC | PRN

## 2014-01-22 MED ORDER — OXYTOCIN 40 UNITS IN LACTATED RINGERS INFUSION - SIMPLE MED: 62.5000 mL/h | INTRAVENOUS | Status: AC

## 2014-01-22 MED ORDER — SIMETHICONE 80 MG PO CHEW
80.0000 mg | CHEWABLE_TABLET | ORAL | Status: DC | PRN
  Administered 2014-01-22 (×2): 80 mg via ORAL

## 2014-01-22 MED ORDER — DIPHENHYDRAMINE HCL 25 MG PO CAPS
25.0000 mg | ORAL_CAPSULE | Freq: Four times a day (QID) | ORAL | Status: DC | PRN
Start: 2014-01-22 — End: 2014-01-24

## 2014-01-22 MED ORDER — KETOROLAC TROMETHAMINE 30 MG/ML IJ SOLN
INTRAMUSCULAR | Status: AC
  Filled 2014-01-22: qty 1

## 2014-01-22 MED ORDER — SIMETHICONE 80 MG PO CHEW
80.0000 mg | CHEWABLE_TABLET | Freq: Three times a day (TID) | ORAL | Status: DC
  Administered 2014-01-23 – 2014-01-24 (×5): 80 mg via ORAL
  Filled 2014-01-22 (×7): qty 1

## 2014-01-22 MED ORDER — MISOPROSTOL 200 MCG PO TABS
800.0000 ug | ORAL_TABLET | Freq: Once | ORAL | Status: AC
Start: 2014-01-22 — End: 2014-01-22
  Administered 2014-01-22: 800 ug via ORAL
  Filled 2014-01-22: qty 4

## 2014-01-22 MED ORDER — TETANUS-DIPHTH-ACELL PERTUSSIS 5-2.5-18.5 LF-MCG/0.5 IM SUSP: 0.5000 mL | Freq: Once | INTRAMUSCULAR | Status: DC

## 2014-01-22 NOTE — Addendum Note (Signed)
Addendum  created 01/22/14 0849 by Shanon Payor, CRNA   Modules edited: Notes Section   Notes Section:  File: [id number]

## 2014-01-22 NOTE — Progress Notes (Signed)
Subjective: Postpartum Day #1: Cesarean Delivery/BTL Patient reports tolerating PO. Was having some increased bldg (a couple of dime-sized clots) earlier this morning for which she was recently given cytotec . Now with some shaking. Using [place], the pt denies pain. States she feels well. Is breastfeeding.    Objective: Vital signs in last 24 hours: Temp:  [97.9 F (36.6 C)-99 F (37.2 C)] 98.9 F (37.2 C) (01/02 0725) Pulse Rate:  [55-157] 79 (01/02 0730) Resp:  [16-26] 20 (01/02 0330) BP: (55-150)/(29-94) 137/77 mmHg (01/02 0730) SpO2:  [93 %-100 %] 97 % (01/02 0730)  Physical Exam:  General: alert, cooperative and mild distress  Heart: RRR Lungs: nl effort Lochia: appropriate Uterine Fundus: firm; abd sl distended but soft Incision: honeycomb intact, clean and dry DVT Evaluation: No evidence of DVT seen on physical exam.   Recent Labs  01/19/14 0945 01/22/14 0603  HGB 11.2* 8.8*  HCT 33.1* 26.5*    Assessment/Plan: Status post Cesarean section. Doing well postoperatively.  Continue current care.  Given pt's nl BP and pulse, will continue to observe and have nursing call with any further changes; no s/s of internal bldg.  Tavaughn Silguero CNM 01/22/2014, 9:30 AM

## 2014-01-22 NOTE — Lactation Note (Signed)
This note was copied from the chart of Tiffany Poole. Lactation Consultation Note  Patient Name: Tiffany Destynie Toomey [id number] Date: 01/22/2014 Reason for consult: Follow-up assessment of this mom and baby, now 20 hours pp.  Baby has had 10-20 minute feedings and more than minimum output for this hour of life.  Mom is awake and states, through female friend/interpreter, that she feels better.   Baby is nursing well with LATCH scores=8 after birth and multiple feedings today.  Blood sugars were wnl.  Mom saying "no milk" so LC reviewed colostrum phase of milk production and encouraged frequent STS and cue feedings as best way to provide milk and stimulate production for her newborn baby.  Mom breastfed all 10 other children for 2 years. Mom encouraged to feed baby 8-12 times/24 hours and with feeding cues. LC encouraged review of Baby and Me pp 9, 14 and 20-25 for STS and BF information.    Maternal Data    Feeding    LATCH Score/Interventions           LATCH scores=8 after delivery (per [facility])           Lactation Tools Discussed/Used   STS, cue feedings, supply and demand for milk production  Consult Status Consult Status: Follow-up Date: 01/23/14 Follow-up type: In-patient    Warrick Parisian [facility] 01/22/2014, 5:10 PM

## 2014-01-22 NOTE — Progress Notes (Signed)
Called language line to assist with communication during pt assessment. Provided education on cramping with breastfeeding, informed mother of the plan of care for the night. Made sure that pt did not have any questions for nurse. Provided education on the need for PKU test, but will check TcB first to minimize invasive procedures for baby. Made sure that the pt knew how to reach nurse if she needed anything. Sherald Barge

## 2014-01-22 NOTE — Anesthesia Postprocedure Evaluation (Signed)
  Anesthesia Post-op Note  Patient: Tiffany Poole  Procedure(s) Performed: Procedure(s): CESAREAN SECTION (N/A) BILATERAL TUBAL LIGATION  Patient Location: Mother/Baby  Anesthesia Type:Spinal  Level of Consciousness: awake, alert  and oriented  Airway and Oxygen Therapy: Patient Spontanous Breathing  Post-op Pain: none  Post-op Assessment: Post-op Vital signs reviewed, Patient's Cardiovascular Status Stable, Respiratory Function Stable, Pain level controlled, No headache, No backache, No residual numbness and No residual motor weakness  Post-op Vital Signs: Reviewed and stable  Last Vitals:  Filed Vitals:   01/22/14 0730  BP: 137/77  Pulse: 79  Temp:   Resp:     Complications: No apparent anesthesia complications

## 2014-01-22 NOTE — Progress Notes (Signed)
Called language line for ordering dinner for patient. All questions answered.

## 2014-01-22 NOTE — Progress Notes (Signed)
Language line used to educated and inform patient of plan of care today. All questions answered. Patient encouraged to feed infant on feeding cues.

## 2014-01-23 NOTE — Progress Notes (Signed)
Called language line to communicate with mother about medications that were due and reassess her pain level. Encouraged to call nurse with any questions/issues. Sherald Barge

## 2014-01-23 NOTE — Progress Notes (Signed)
Language line used with morning assessment, patient denies questions at this time. Patient provided with list of doctors for outpatient circs. Breakfast and lunch order placed.

## 2014-01-23 NOTE — Lactation Note (Signed)
This note was copied from the chart of Tiffany Poole. Lactation Consultation Note  Patient Name: Tiffany Karliah Kowalchuk [id number] Date: 01/23/2014 Reason for consult: Follow-up assessment   Follow-up visit with infant at 69 hours old.  Infant has a 9% weight loss and is on double phototherapy.  Mom is a P10 who does not speak [place].  Female friends in room interpreted instructions from [facility] as LC did teaching.  According to chart, infant has breastfed x4 (20 min-clusterfeeding) + formula x3 (6-75ml) in past 24 hours; voids-3 in 24 / 5 life; stools-3 in 24 hrs/ 5 life.  Mom stated she is formula feeding after breastfeeding.  Hand expression taught with only a small drop of colostrum noted.  Curved-tip syringe, spoons, and colostrum collection container given and instructed how to use.  When teaching mom hand expression she and the other women in the room were giggling and mom did not seem interested in trying to continue.  LC offered getting her a hand pump and mom consented.  Taught mom how to use hand pump with lots of instruction needed and also taught her how to do hands-on pumping; after several minutes of using hand pump mom saw a small drop of colostrum appearing on nipple and wanted to latch infant.  LC assisted with latching.  Taught mom to use cross-cradle hold with latching to left breast.  Infant latched with depth and flanged lips and rhythmical sucking; several swallows heard; LS-9.  Instructed mom to breastfeed infant every 2-3 hours and then supplement with formula after breastfeeding since he is on double phototherapy and has a 9% weight loss.  Mom stated she will wait until day 3 when her milk comes in to supplement with EBM.  Education given on supply/demand and importance of continuing to stimulate breast with feedings or pumping every few hours.  Communicated with RN about the lactation consult.     Maternal Data Has patient been taught Hand Expression?: Yes  Feeding Feeding Type: Breast  Fed Length of feed: 20 min  LATCH Score/Interventions Latch: Grasps breast easily, tongue down, lips flanged, rhythmical sucking. Intervention(s): Teach feeding cues;Skin to skin  Audible Swallowing: Spontaneous and intermittent  Type of Nipple: Everted at rest and after stimulation  Comfort (Breast/Nipple): Soft / non-tender     Hold (Positioning): Assistance needed to correctly position infant at breast and maintain latch. Intervention(s): Breastfeeding basics reviewed;Support Pillows;Skin to skin  LATCH Score: 9  Lactation Tools Discussed/Used Pump Review: Setup, frequency, and cleaning;Milk Storage Initiated by:: S.Gregary Blackard RN, IBCLC Date initiated:: 01/23/14   Consult Status Consult Status: Follow-up Date: 01/24/14 Follow-up type: In-patient    [name] 01/23/2014, 7:48 PM

## 2014-01-23 NOTE — Progress Notes (Signed)
Post Partum Day 2 Subjective: no complaints, up ad lib, voiding and tolerating PO  Objective: Blood pressure 105/68, pulse 68, temperature 98.1 F (36.7 C), temperature source Oral, resp. rate 18, height  (1.6 m), weight 201 lb (91.173 kg), last menstrual period 05/05/2013, SpO2 98 %, unknown if currently breastfeeding.  Physical Exam:  General: alert, cooperative and no distress Lochia: appropriate Uterine Fundus: firm Incision: healing well, no significant drainage, no dehiscence, no significant erythema DVT Evaluation: No evidence of DVT seen on physical exam. Negative Homan's sign. No cords or calf tenderness. No significant calf/ankle edema.   Recent Labs  01/22/14 0603  HGB 8.8*  HCT 26.5*    Assessment/Plan: Plan for discharge tomorrow   LOS: 4 days   Keleigh Kazee DARLENE 01/23/2014, 10:01 AM

## 2014-01-23 NOTE — Progress Notes (Signed)
Spoke with Mom via language line and gave Motrin this morning. Mother was extensively educated on phototherapy light usage, to leave foam glasses on to protect eyes from light, and to leave the baby only in a diaper while under bili lights. Mom was also advised to not use Vaseline on the baby's bottom while under bili lights to prevent any potential burns. Mom verbalized understanding via language line, and stated that she had no questions for me after education. Sherald Barge

## 2014-01-24 ENCOUNTER — Encounter (HOSPITAL_COMMUNITY): Payer: Self-pay | Admitting: Obstetrics and Gynecology

## 2014-01-24 MED ORDER — OXYCODONE-ACETAMINOPHEN 5-325 MG PO TABS: 1.0000 | ORAL_TABLET | ORAL | Status: AC | PRN

## 2014-01-24 MED ORDER — IBUPROFEN 600 MG PO TABS: 600.0000 mg | ORAL_TABLET | Freq: Four times a day (QID) | ORAL | Status: AC | PRN

## 2014-01-24 NOTE — Discharge Summary (Signed)
Obstetric Discharge Summary Reason for Admission: induction of labor and cesarean section Prenatal Procedures: ultrasound Intrapartum Procedures: cesarean: low cervical, transverse and tubal ligation Postpartum Procedures: none Complications-Operative and Postpartum: none HEMOGLOBIN  Date Value Ref Range Status  01/22/2014 8.8* 12.0 - 15.0 g/dL Final  16/10/9602 54.0 g/dL Final   HCT  Date Value Ref Range Status  01/22/2014 26.5* 36.0 - 46.0 % Final  09/20/2013 33 % Final    Physical Exam:  General: alert, cooperative, appears stated age and no distress Lochia: appropriate Uterine Fundus: firm Incision: healing well, no significant drainage, no dehiscence, no significant erythema DVT Evaluation: No evidence of DVT seen on physical exam. Negative Homan's sign. No cords or calf tenderness. No significant calf/ankle edema.  Discharge Diagnoses: Term Pregnancy-delivered  Discharge Information: Date: 01/24/2014 Activity: pelvic rest Diet: routine Medications: PNV, Ibuprofen and Percocet Condition: stable and improved Instructions: refer to practice specific booklet Discharge to: home   Newborn Data: Live born female  Birth Weight: 7 lb 10.9 oz (3484 g) APGAR: 9, 9  Home with mother.  Tiffany Poole 01/24/2014, 6:49 AM

## 2014-01-24 NOTE — Progress Notes (Signed)
Discharge instructions reviewed with patient with interpreter of choice at bedside.Patient stated her understanding via the interpreter. Boykin Peek, RN

## 2014-01-24 NOTE — Progress Notes (Signed)
Ur chart review completed.  

## 2014-01-24 NOTE — Discharge Instructions (Signed)

## 2014-01-24 NOTE — Lactation Note (Signed)
This note was copied from the chart of Tiffany Poole. Lactation Consultation Note  Patient Name: Tiffany Poole Date: 01/24/2014 Reason for consult: Follow-up assessment Mom is breast/bottle feeding. Photo therapy d/c's, serum bili scheduled for 1400 today. Per family friend as interpreter, Mom reports baby is nursing well. Was unable to see latch at this visit, baby asleep and per Mom recently fed. Mom reports she is BF before giving supplements, however with history not sure this is the case. Stressed importance for baby to be at the breast with each feeding. Encouraged to BF both breasts each feeding before giving supplements. Advised to monitor voids/stools. Engorgement care reviewed if needed. Encouraged Mom to call if she would like LC assist with next feeding.   Maternal Data    Feeding Feeding Type: Breast Fed Length of feed: 15 min  LATCH Score/Interventions                      Lactation Tools Discussed/Used Tools: Pump Breast pump type: Manual   Consult Status Consult Status: Follow-up Date: 01/25/14 Follow-up type: In-patient    Alfred Levins 01/24/2014, 12:29 PM

## 2014-01-25 LAB — GLUCOSE, CAPILLARY: Glucose-Capillary: 131 mg/dL — ABNORMAL HIGH (ref 70–99)

## 2014-02-28 ENCOUNTER — Ambulatory Visit (INDEPENDENT_AMBULATORY_CARE_PROVIDER_SITE_OTHER): Payer: Medicaid Other | Admitting: Certified Nurse Midwife

## 2014-02-28 ENCOUNTER — Encounter: Payer: Self-pay | Admitting: Obstetrics and Gynecology

## 2014-02-28 NOTE — Patient Instructions (Signed)

## 2014-02-28 NOTE — Progress Notes (Signed)
  Subjective:     Tiffany Poole is a 34 y.o. female [id number] who presents for a postpartum visit. She is 5 weeks postpartum following a low cervical transverse Cesarean section with bilateral tubal ligation. I have fully reviewed the prenatal and intrapartum course. Pt was A1 GDM well controlled during the pregnancy. The delivery was at 40+0 gestational weeks. Outcome: primary cesarean section, low vertical incision. Anesthesia: spinal. Postpartum course has been uneventful. Baby's course has been uneventful. Baby is feeding by both breast and bottle - type of formula unknown. Vaginal Bleeding- Short course of vaginal bleeding after C/S. no bleeding now.  Bowel function is normal. Bladder function is normal. Patient is not sexually active. Contraception method is bilateral tubal ligation . Postpartum depression screening: negative.  The following portions of the patient's history were reviewed and updated as appropriate: past family history and past social history.  Review of Systems A comprehensive review of systems was negative.   Objective:    BP 107/84 mmHg  Pulse 83  Temp(Src) 98 F (36.7 C)  Wt 182 lb 11.2 oz (82.872 kg)  Breastfeeding? Yes  General:  alert, cooperative and no distress   Breasts:  negative  Lungs: clear to auscultation bilaterally  Heart:  regular rate and rhythm, S1, S2 normal, no murmur, click, rub or gallop  Abdomen: Abdomen soft to palpation; incision without redness edema or drainage   Vulva:  not evaluated  Vagina: not evaluated  Cervix:  Not evaluated  Corpus: not examined  Adnexa:  not evaluated  Rectal Exam: Not performed.        Assessment:    5 week postpartum exam. Pap smear not done at today's visit.   Plan:    1. Contraception: tubal ligation 2.   Schedule for 2 hour OGTT  3. Follow up in: 1 year or as needed  or as needed.

## 2014-03-01 ENCOUNTER — Telehealth: Payer: Self-pay

## 2014-03-01 NOTE — Telephone Encounter (Signed)
-----   Message from [facility] Grissett [place], [place] sent at 02/28/2014  2:03 PM EST ----- Gestational Diabetic will need to be scheduled for a 2 hour OGTT.

## 2014-03-01 NOTE — Telephone Encounter (Signed)
[employer] interpreter [phone] used for this encounter. Informed patient of need for 2hr gtt. Patient states she will need to contact her case worker to help get her transportation. Patient states she would like to schedule visit for next Monday 03/07/14 at 0900. Informed patient she will need to be fasting. Patient verbalized understanding. No questions or concerns.

## 2014-03-07 ENCOUNTER — Other Ambulatory Visit: Payer: Medicaid Other

## 2014-03-09 ENCOUNTER — Other Ambulatory Visit: Payer: Medicaid Other

## 2014-03-09 DIAGNOSIS — O24429 Gestational diabetes mellitus in childbirth, unspecified control: Secondary | ICD-10-CM

## 2014-03-10 ENCOUNTER — Telehealth: Payer: Self-pay

## 2014-03-10 ENCOUNTER — Encounter: Payer: Self-pay | Admitting: Obstetrics & Gynecology

## 2014-03-10 LAB — GLUCOSE TOLERANCE, 2 HOURS
GLUCOSE, FASTING: 98 mg/dL (ref 70–99)
Glucose, 2 hour: 89 mg/dL (ref 70–139)

## 2014-03-10 NOTE — Telephone Encounter (Signed)
-----   Message from Tereso NewcomerUgonna A Anyanwu, MD sent at 03/10/2014  9:34 AM EST ----- Normal 2 hr GTT; no evidence of DM outside pregnancy for now.  Follow up with PCP as usual for routine preventative health care. Please call to inform patient of results and recommendations.

## 2014-03-10 NOTE — Telephone Encounter (Signed)
Called patient with [employer] interpreter [phone]#[id number]. Informed patient of results and recommendations. Patient verbalized understanding and gratitude. No questions or concerns.

## 2014-03-14 ENCOUNTER — Other Ambulatory Visit: Payer: Medicaid Other

## 2014-03-24 ENCOUNTER — Encounter: Payer: Self-pay | Admitting: Obstetrics & Gynecology

## 2014-04-26 ENCOUNTER — Encounter: Payer: Self-pay | Admitting: General Practice

## 2014-11-25 ENCOUNTER — Ambulatory Visit: Payer: Self-pay | Admitting: Orthopedic Surgery

## 2014-11-26 IMAGING — US US FETAL BPP W/O NONSTRESS
1 series · 13 of 14 positions shown · non-contrast
Comparison: none

[Series 1: us fetal bpp w/o nonstress · non-contrast · 14 acquisitions, 13 frames shown]
[im 1/14]
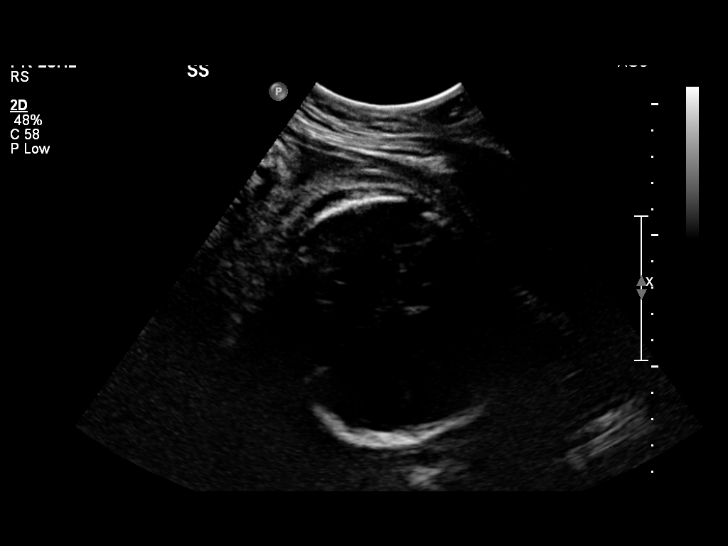
[im 2/14]
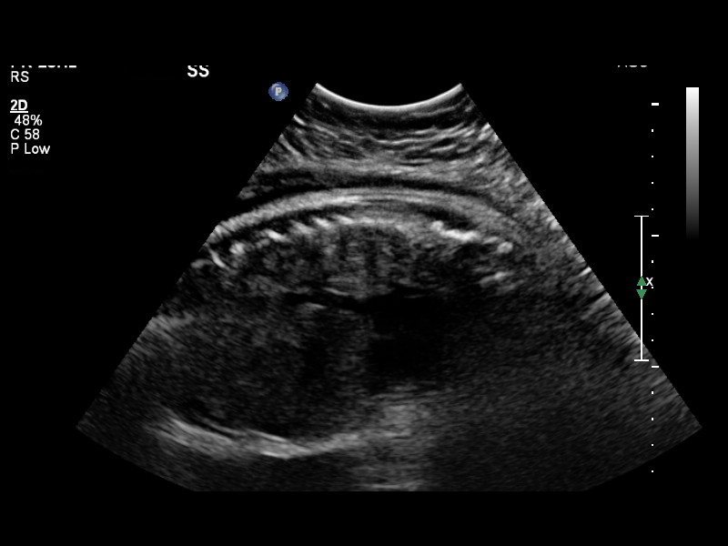
[im 3/14]
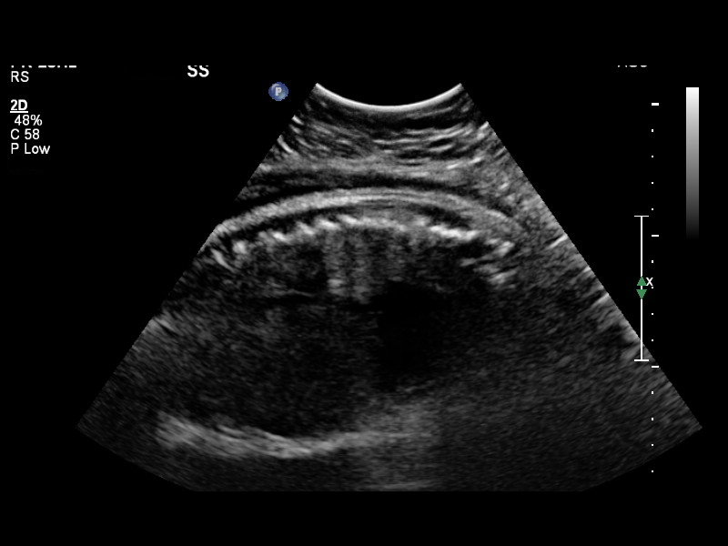
[im 4/14]
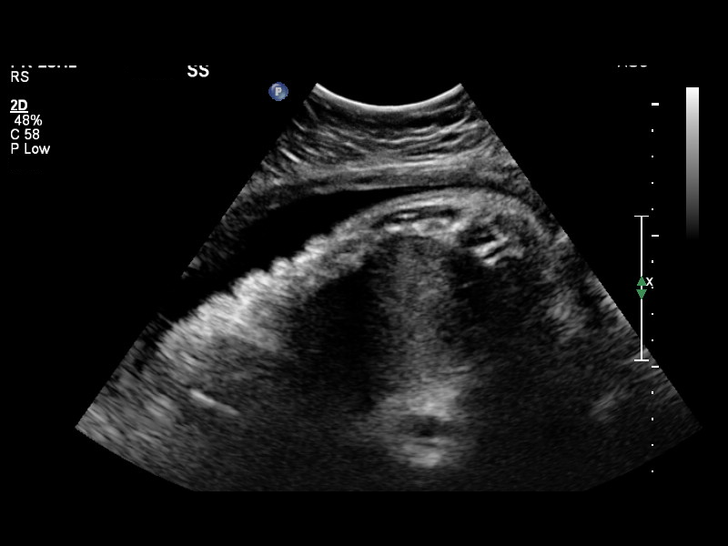
[im 5/14]
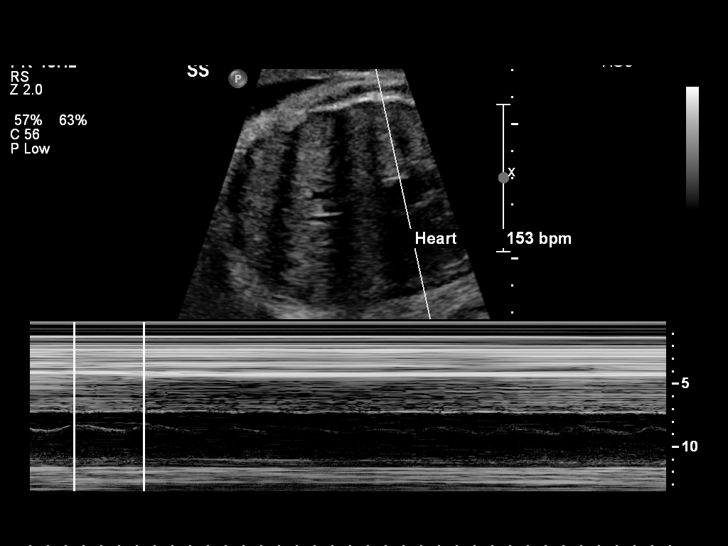
[im 6/14]
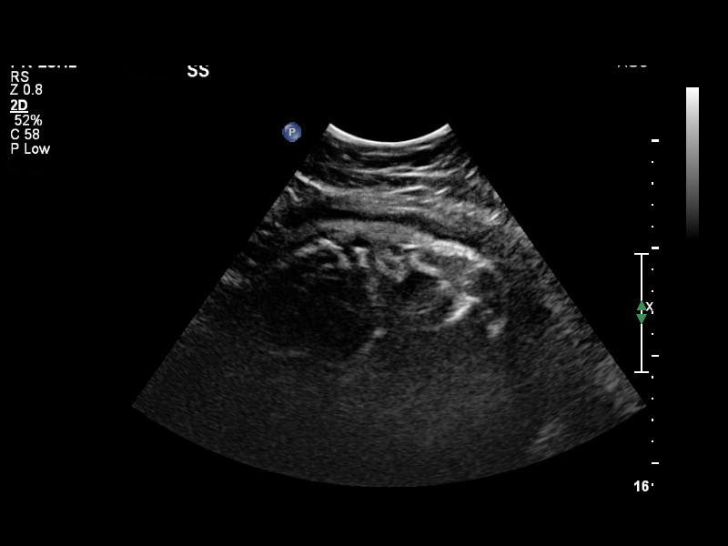
[im 8/14]
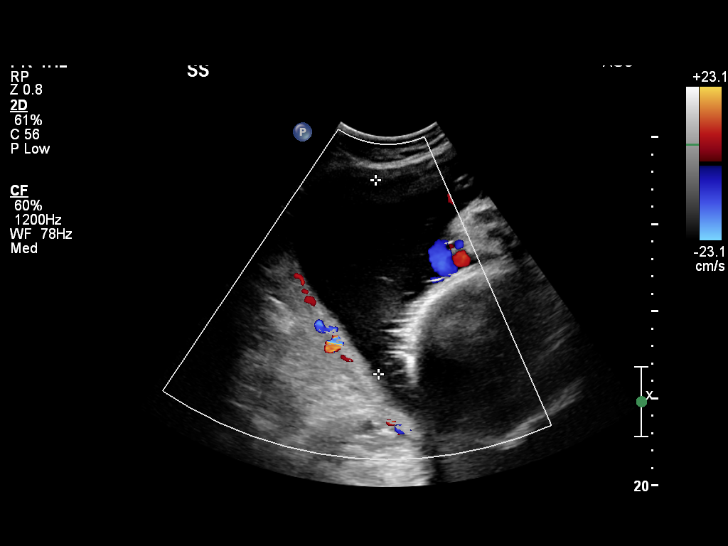
[im 9/14]
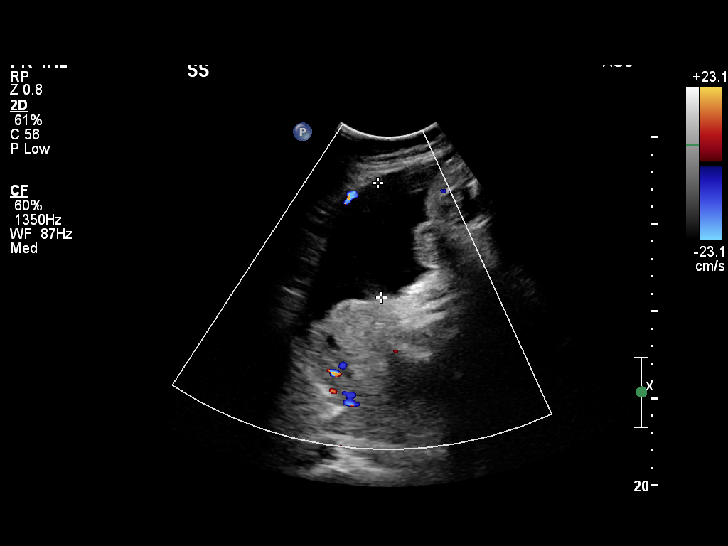
[im 10/14]
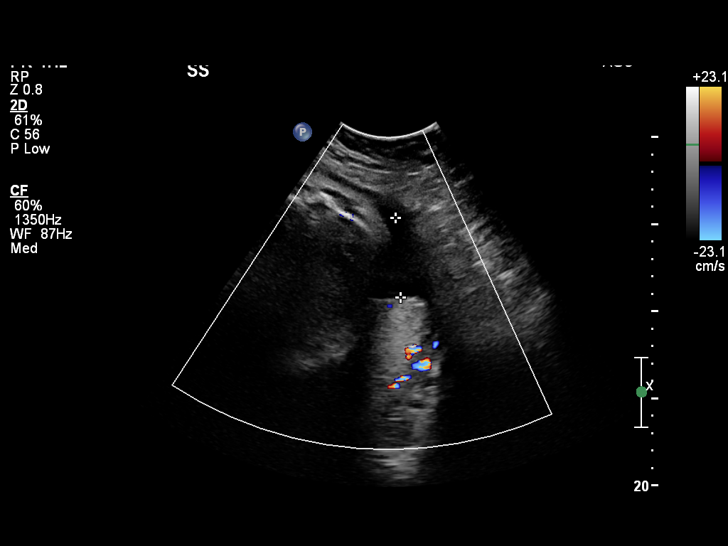
[im 11/14]
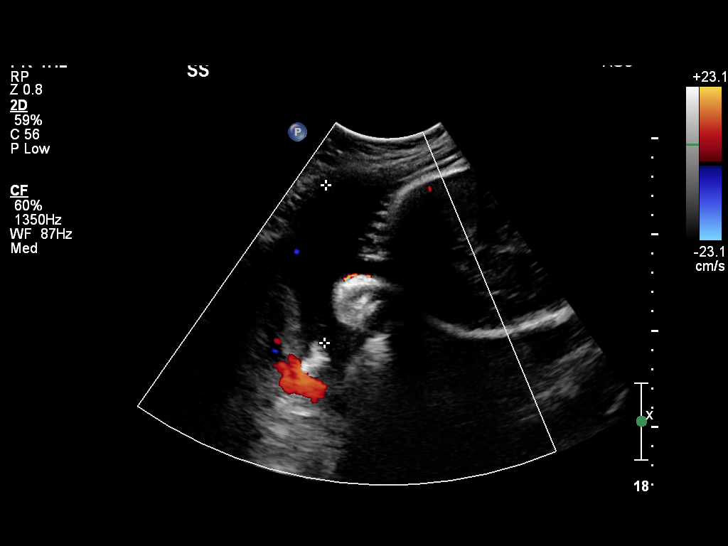
[im 12/14]
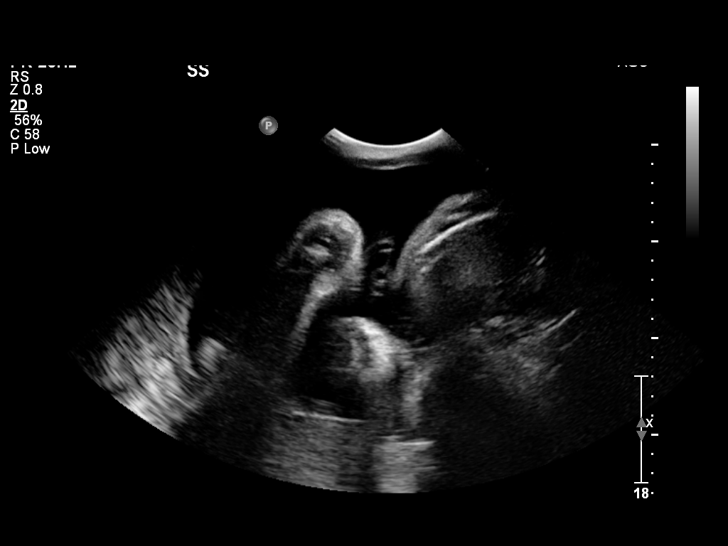
[im 13/14]
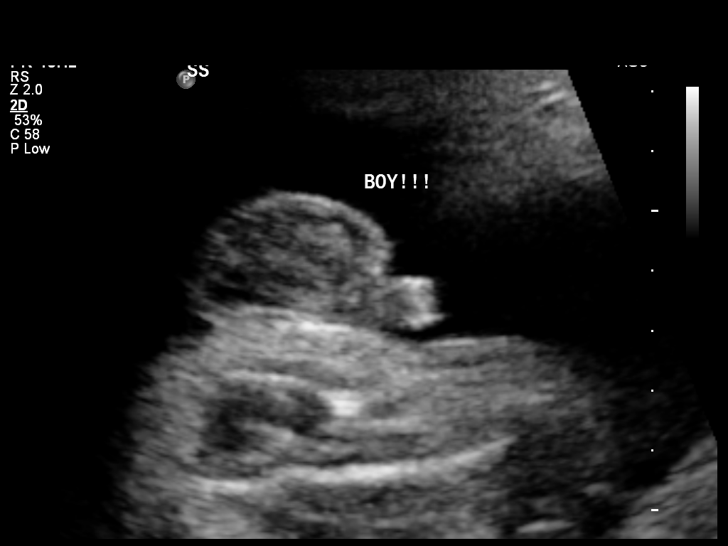
[im 14/14]
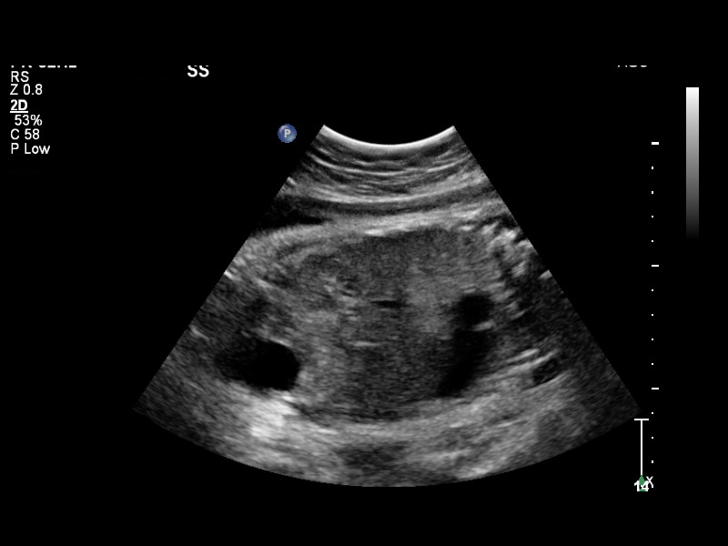

[13 of 14 positions shown; findings below may reference images not displayed]

OBSTETRICS REPORT
                      (Signed Final 12/13/2013 [DATE])

Service(s) Provided

Indications

 Advanced maternal age multigravida 35+, third
 trimester
 Gestational diabetes in pregnancy, diet controlled
 Poor obstetric history: Previous IUFD (stillbirth)
 Polyhydramnios
 34 weeks gestation of pregnancy
Fetal Evaluation

 Num Of Fetuses:    1
 Fetal Heart Rate:  153                          bpm
 Cardiac Activity:  Observed
 Presentation:      Cephalic

 Amniotic Fluid
 AFI FV:      Polyhydramnios
 AFI Sum:     30.37   cm     > 97  %Tile     Larg Pckt:  11.09   cm
 RUQ:   11.09   cm   RLQ:    8.18   cm    LUQ:   6.54    cm   LLQ:    4.56   cm
Biophysical Evaluation

 Amniotic F.V:   Polyhydramnios             F. Tone:        Observed
 F. Movement:    Observed                   Score:          [DATE]
 F. Breathing:   Observed
Gestational Age

 LMP:           31w 5d        Date:  05/05/13                 EDD:   02/09/14
 Best:          34w 5d     Det. By:  U/S (10/01/13)           EDD:   01/19/14
Impression

 Single IUP at 34w 5d
 Polyhydramnios, hx of previous IUFD, GDM
 BPP [DATE]
 Polyhydramnios again noted with an AFI of 30.4 cm
Recommendations

 Continue 2x weekly NSTs with weekly AFIs
 Ultrasound for growth in 2-3 weeks
 If polyhydramnios persists, recommend delivery at 39 weeks
 gestation in the absence of other complications.

 questions or concerns.

## 2014-12-20 IMAGING — US US OB FOLLOW-UP
1 series · 12 of 28 positions shown · non-contrast
Comparison: none

[Series 1: us ob follow-up · 0.20mm/px · 47 acquisitions, 12 frames shown]
[im 2/47]
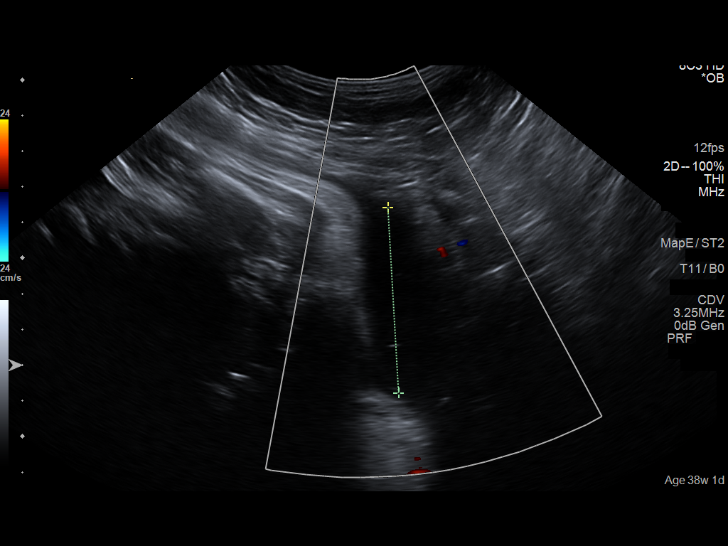
[im 6/47]
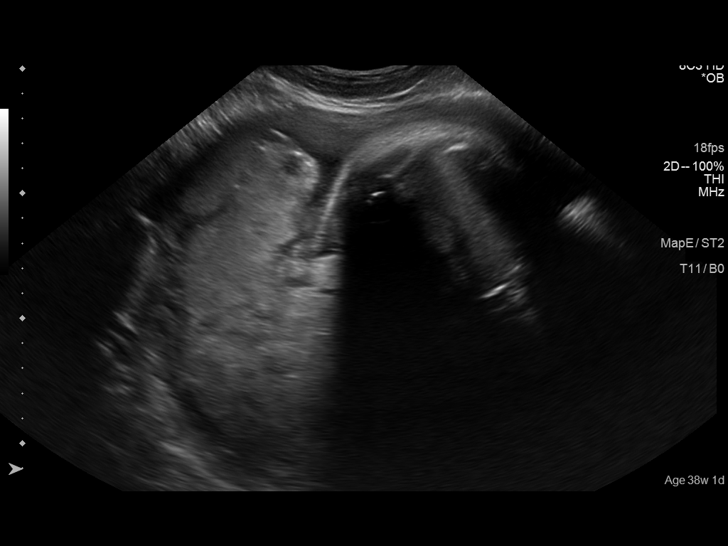
[im 9/47]
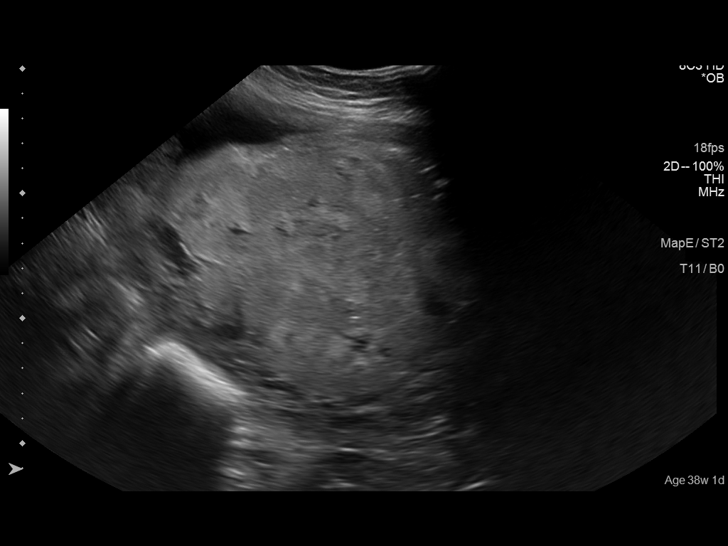
[im 14/47]
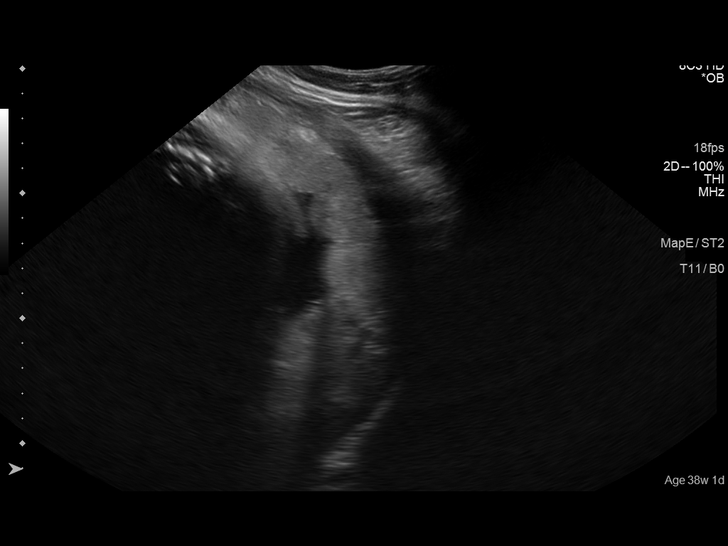
[im 18/47]
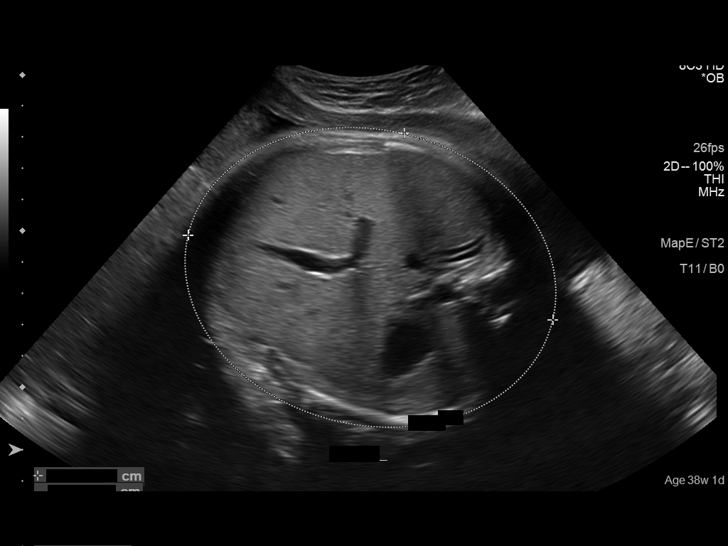
[im 21/47]
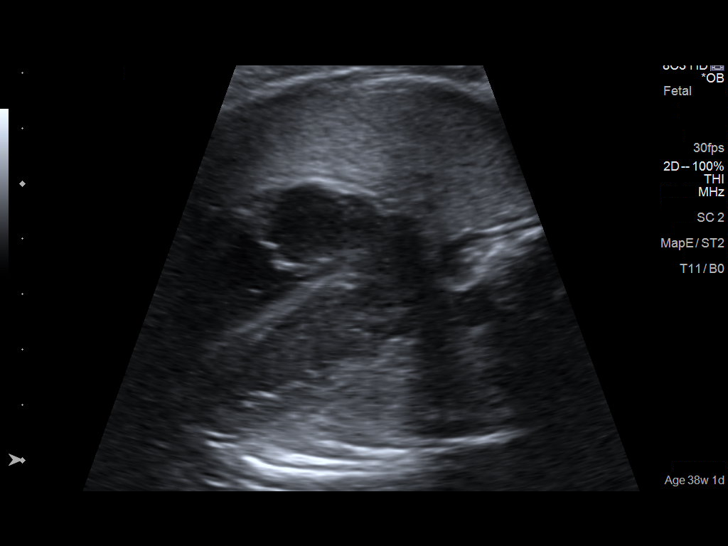
[im 26/47]
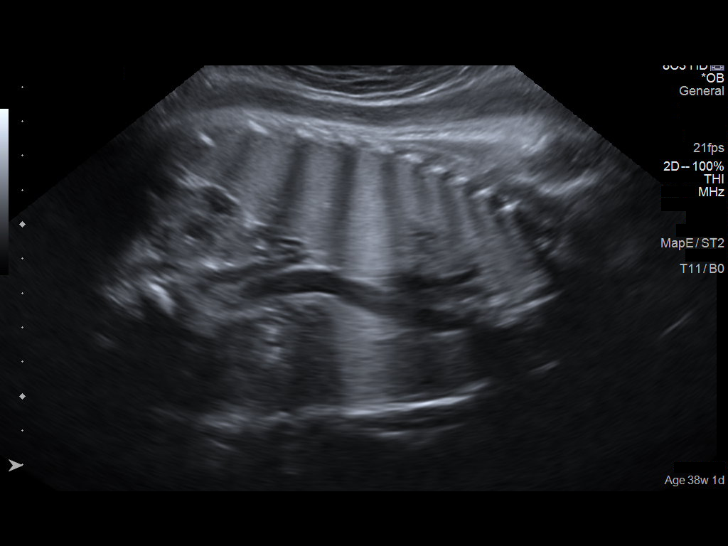
[im 29/47]
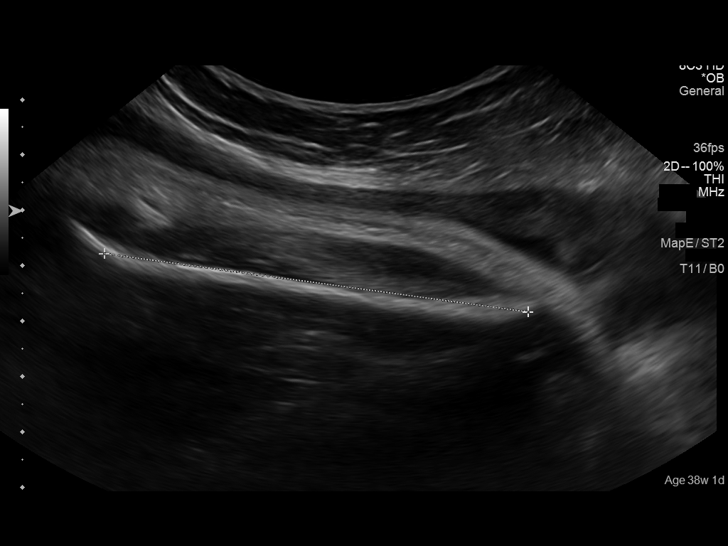
[im 33/47]
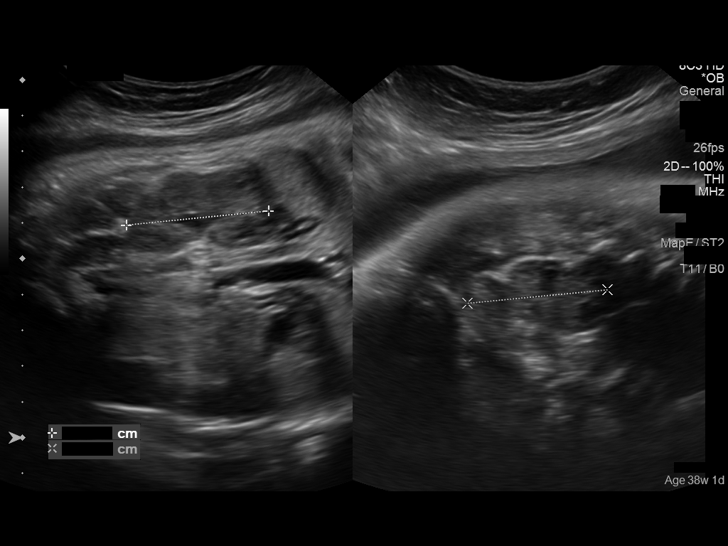
[im 38/47]
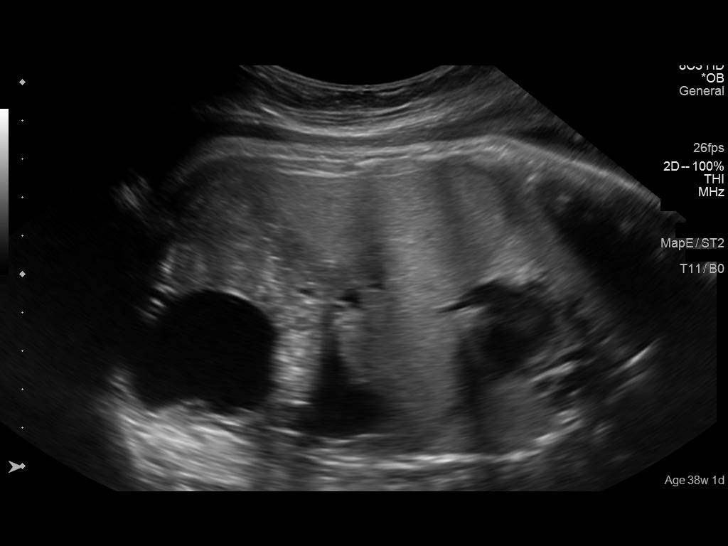
[im 41/47]
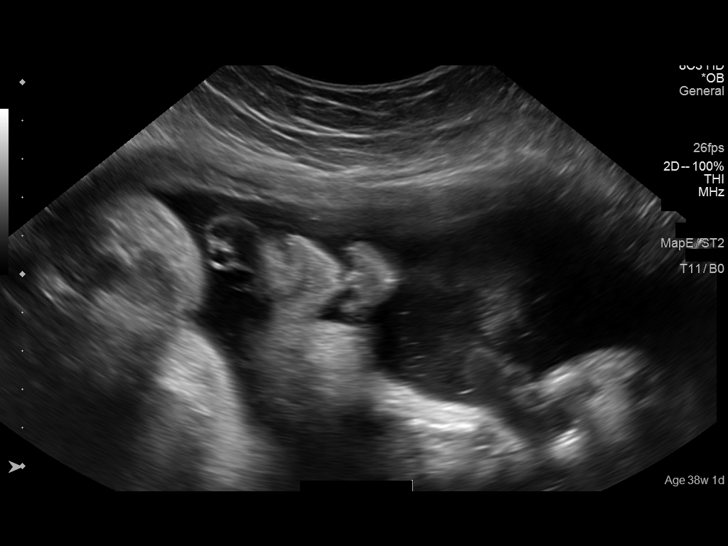
[im 45/47]
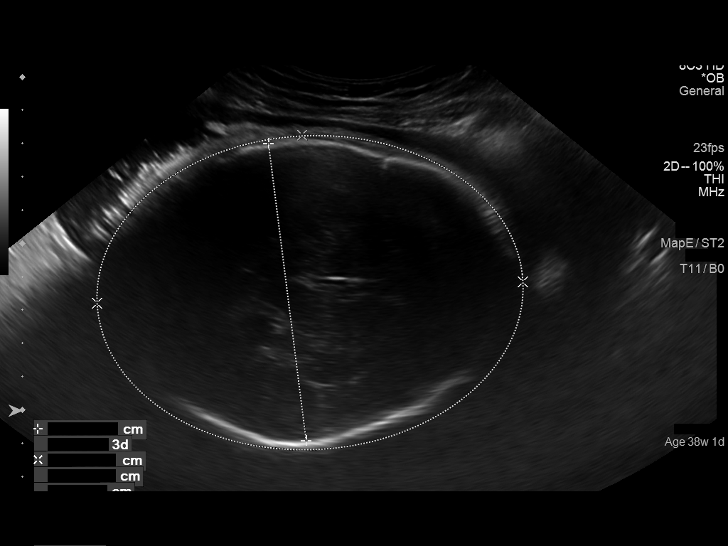

[12 of 28 positions shown; findings below may reference images not displayed]

OBSTETRICS REPORT
                      (Signed Final 01/07/2014 [DATE])

Service(s) Provided

 US OB FOLLOW UP                                       76816.1
Indications

 Advanced maternal age multigravida 35, third
 trimester
 Gestational diabetes in pregnancy, diet controlled
 Poor obstetric history: Previous IUFD (stillbirth)
 Polyhydramnios
 38 weeks gestation of pregnancy
Fetal Evaluation

 Num Of Fetuses:    1
 Fetal Heart Rate:  133                          bpm
 Cardiac Activity:  Observed
 Presentation:      Cephalic
 Placenta:          Posterior, above cervical
                    os
 P. Cord            Visualized, central
 Insertion:

 Amniotic Fluid
 AFI FV:      Polyhydramnios
 AFI Sum:     24.11    cm      95  %Tile      Larg Pckt:  10.15  cm
 RUQ:   5.2     cm   RLQ:    2.94   cm    LUQ:    5.82   cm    LLQ:   10.15   cm
Biometry

 BPD:     89.9   mm    G. Age:  36w 3d                CI:        66.91    70 - 86
                                                      FL/HC:       21.8   20.9 -

 HC:     352.1   mm    G. Age:  41w 1d       91   %   HC/AC:       1.04   0.92 -

 AC:     338.5   mm    G. Age:  37w 5d       57   %   FL/BPD:      85.5   71 - 87
 FL:      76.9   mm    G. Age:  39w 2d       80   %   FL/AC:       22.7   20 - 24
 HUM:     66.7   mm    G. Age:  38w 5d       88   %

 Est. FW:    8820   gm    7 lb 10 oz     81  %
Gestational Age

 LMP:           35w 1d        Date:  05/05/13                 EDD:    02/09/14
 U/S Today:     38w 4d                                        EDD:    01/16/14
 Best:          38w 1d     Det. By:  U/S (10/01/13)           EDD:    01/19/14
Anatomy

 Cranium:          Appears normal         Aortic Arch:       Previously seen
 Fetal Cavum:      Previously seen        Ductal Arch:       Previously seen
 Ventricles:       Appears normal         Diaphragm:         Appears normal
 Choroid Plexus:   Previously seen        Stomach:           Appears normal, left
                                                             sided
 Cerebellum:       Previously seen        Abdomen:           Previously seen
 Posterior Fossa:  Previously seen        Abdominal Wall:    Previously seen
 Nuchal Fold:      Not applicable (>20    Cord Vessels:      Previously seen
                   wks GA)
 Face:             Appears normal         Kidneys:           Appear normal
                   (orbits and profile)
 Lips:             Appears normal         Bladder:           Appears normal
 Heart:            Appears normal         Spine:             Previously seen
                   (4CH, axis, and
                   situs)
 RVOT:             Appears normal         Lower              Previously seen
                                          Extremities:
 LVOT:             Appears normal         Upper              Previously seen
                                          Extremities:

 Other:  Fetus appears to be a male. Heels and 5th digit appear previously
         visualized.
Cervix Uterus Adnexa

 Cervix:       Not visualized (advanced GA >65wks)
 Uterus:       No abnormality visualized.
 Cul De Sac:   No free fluid seen.
 Left Ovary:    Not visualized.
 Right Ovary:   Not visualized.
 Adnexa:     No abnormality visualized.
Impression

 SIUP at 38+1 weeks
 Normal interval anatomy; anatomic survey complete
 High normal amniotic fluid volume vs mild polyhydramnios
 Appropriate interval growth with EFW at the 81st %tile
Recommendations

 Continue twice weekly NSTs with weekly AFIs

 questions or concerns.

## 2015-01-19 ENCOUNTER — Encounter: Payer: Self-pay | Admitting: Dermatology

## 2015-01-19 ENCOUNTER — Ambulatory Visit: Payer: Self-pay | Admitting: Dermatology

## 2015-01-19 VITALS — BP 110/62 | Ht 65.5 in | Wt 237.3 lb

## 2015-01-19 DIAGNOSIS — M6740 Ganglion, unspecified site: Secondary | ICD-10-CM

## 2015-01-19 NOTE — Patient Instructions (Signed)
Cyst (Likely ganglion cyst) on dorsal left hand  --diagnosis and treatment options discussed with the patient  Patient reassured  Referred to orthopedic surgery for assessment    Courtney Lambert  [address] [place]  , [place] [id number]  Appointment: [phone]

## 2015-01-19 NOTE — Progress Notes (Addendum)
PCP: Unknown, Provider    Chief complaint: bump on hand    Derm History: None    HPI:   Courtney Lambert is a pleasant 34 y.o. [place]i female here accompanied by her nephew who acts to translate. She is here for the concern noted above.    Problem: Bump  Location: Left dorsal hand  Duration: about 1 year  Associated signs/symptoms: Enlarging and Asymptomatic  Modifying factors (prior treatments/current treatment):   None    ROS:   Integumentary ROS: positive for changing skin lesions    Physical Exam:    Vitals:    08/18/14 1342   BP: 126/72   Weight: 63.5 kg (140 lb)   Height: 1.524 m (5')      Pain    08/18/14 1342   PainSc:   0 - No pain      General: Alert and oriented,  NAD.   Skin: Fitzpatrick skin type  IV  All of the following were examined and were within normal limits except as noted:  - General: Awake and alert   -Face/Neck/Scalp:  -Chest/Abdomen/Back:  -BUE/hands:  Over the left dorsal hand is a mobile, non-tender subcutaneous nodule without overlying skin changes      Assessment/Plan:   Cyst (Likely ganglion cyst) on dorsal left hand  --diagnosis and treatment options discussed with the patient  Patient reassured  Referred to orthopedic surgery for assessment/potential removal  [facility]  [address]  [place], [place] [id number]  Appointment: [phone]       The patient was seen and examined by myself and  Dr Cherly BeachSomers    Barriers to learning: Non-english speaker; patient's nephew acted as Nurse, learning disabilitytranslator for [employer]omali dialect    Return to Clinic: PRN    Darcus Pesteranso Ako-Adjei, MD PhD PGY3          I saw and evaluated this patient. I agree with the resident's findings and plan of care as documented above. I was present for the key portions of the clinical history, physical exam and treatment plan.    Karleen HampshireKathryn Voshon Petro, MD

## 2015-03-03 ENCOUNTER — Encounter: Payer: Self-pay | Admitting: Surgery

## 2015-03-03 ENCOUNTER — Ambulatory Visit: Payer: Self-pay | Admitting: Surgery

## 2015-03-03 VITALS — BP 149/82 | HR 63 | Temp 96.8°F | Resp 16 | Ht 65.5 in | Wt 235.0 lb

## 2015-03-03 DIAGNOSIS — M67432 Ganglion, left wrist: Secondary | ICD-10-CM

## 2015-03-03 NOTE — Progress Notes (Signed)
Plastic Surgery New Patient Note    Patient seen on 03/03/15 at 12:20 PM    HISTORY OF PRESENT ILLNESS:   Courtney Lambert is a 35 y.o. female who presents to the Plastics Clinic today for new evaluation of cyst on dorsal aspect of left hand. Accompanied by her nephew who helps translate. Cyst on left hand has been present for 3 years. Endorses pain to cyst when she apply's pressure to it and picks things up. Saw dermatology in December 2016 for the cyst. She had the same cyst on her right hand which was surgically removed in [place] when she first arrived to [place] but the patient reports that the cyst was not entirely removed.     REVIEW OF SYSTEMS: Review of Systems   Constitutional: Negative for chills and fever.   Respiratory: Negative.    Cardiovascular: Negative.    Musculoskeletal: Negative.    Skin:        +cyst on left dorsal hand   Neurological: Negative.        Current Outpatient Prescriptions on File Prior to Visit   Medication Sig Dispense Refill    VENTOLIN HFA 108 (90 BASE) MCG/ACT inhaler        No current facility-administered medications on file prior to visit.      Past Medical History:   Diagnosis Date    Asthma      No past surgical history on file.  No family history on file.  Social History     Social History    Marital status: Married     Spouse name: N/A    Number of children: N/A    Years of education: N/A     Social History Main Topics    Smoking status: Never Smoker    Smokeless tobacco: None    Alcohol use No    Drug use: No    Sexual activity: Not Asked     Other Topics Concern    None     Social History Narrative     Occupation: Consulting civil engineer- Producer, television/film/video English.    EXAM:   Vitals:    03/03/15 1211   BP: 149/82   BP Location: Left arm   Patient Position: Sitting   Cuff Size: large adult   Pulse: 63   Resp: 16   Temp: 36 C (96.8 F)   TempSrc: Temporal   SpO2: 99%   Weight: 106.6 kg (235 lb)   Height: 1.664 m (5' 5.5")     CONSTITUTIONAL:  well developed, well nourished, no  acute distress  HEENT: Normocephalic, EOMI, PERRL.    NECK: no adenopathy, no asymmetry, masses, or scars, no jugular venous distention, trachea midline and normal to palpation.  HEART: RRR.   CHEST: symmetric, no deformities, no chest wall tenderness  LUNGS: unlabored respirations, no intercostal retractions or accessory muscle use.  ABDOMEN: soft, nontender, nondistended  BACK: nontender, symmetrical, full range of motion.  EXTREMITIES:  no cyanosis, no edema, intact times four.   NEUROLOGICAL: Alert and oriented x 3, normal mood, behavior, speech, dress, motor activity, and thought processes,  SKIN:  No rashes. Skin color, texture and turgor are normal  Focused exam: left dorsal hand: small subcutaneous cyst which is a mobile, non-tender, without overlying skin changes.     ASSESSMENT AND PLAN:  Patient is 35 y.o. female with cyst on dorsal aspect of left hand. Suspicious for ganglion cyst.      Plan to excise cyst at [facility].    She will  schedule the procedure at her convenience.    All questions and concerns were answered. Dr. Alvester Morin was present for the visit.    AUTHORS  Attestations:  I [facility], am scribing for and in the presence of Dr. Evern Bio on 03/03/15 at 12:32 PM.    I, Natally Ribera Langston Masker, MD, personally performed the services described in this documentation, as scribed by the scribe listed above in my presence, and it is accurate and complete.    Shealee Yordy Langston Masker, MD    03/29/15 12:22 PM

## 2015-04-12 ENCOUNTER — Encounter: Payer: Self-pay | Admitting: Surgery

## 2015-05-01 ENCOUNTER — Encounter: Payer: Self-pay | Admitting: Anesthesiology

## 2015-05-01 NOTE — Anesthesia Preprocedure Evaluation (Signed)
Anesthesia Pre-operative History and Physical for Courtney HarderFatuma Gravett    ______________________________________________________________________________________    Summary:  35 y.o. female with Ganglion cyst of wrist, left (Z61.096(M67.432) presenting for Procedure(s):  WRIST CYST MASS EXCISION, left- 30 minutes by Surgeon(s):  Wilmon ArmsBell, Derek E, MD scheduled for 45 minutes.    By Lula OlszewskiSamuel Leland Bryndan Bilyk, MD at 1:23 PM on 05/01/2015    <[email or web address]>  Anesthesia Evaluation            PULMONARY    + Asthma        GI/HEPATIC/RENAL  Last PO Intake: Enter Last PO Intake in ROS/Med Hx Tab                Physical Exam Not Completed________________________________________________________________________  Ermalinda MemosAnes Plan  Anesthesia Consent Not Performed

## 2015-05-02 ENCOUNTER — Ambulatory Visit: Admission: RE | Admit: 2015-05-02 | Payer: Self-pay | Source: Ambulatory Visit | Admitting: Surgery

## 2015-05-02 ENCOUNTER — Encounter: Admission: RE | Payer: Self-pay | Source: Ambulatory Visit

## 2015-05-02 SURGERY — EXCISION, MASS, WRIST
Anesthesia: General | Site: Wrist | Laterality: Left

## 2015-05-02 MED ORDER — GLUCAGON HCL (RDNA) 1 MG IJ SOLR *WRAPPED*
INTRAMUSCULAR | Status: AC
Start: 2015-05-02 — End: 2015-05-02
  Filled 2015-05-02: qty 1

## 2016-02-09 ENCOUNTER — Other Ambulatory Visit
Admission: RE | Admit: 2016-02-09 | Discharge: 2016-02-09 | Disposition: A | Discharge status: Home / Self Care | Payer: Self-pay | Source: Ambulatory Visit | Attending: Emergency Medicine | Admitting: Emergency Medicine

## 2016-02-09 LAB — COMPREHENSIVE METABOLIC PANEL
ALT: 34 U/L (ref 0–35)
AST: 39 U/L — ABNORMAL HIGH (ref 0–35)
Albumin: 4 g/dL (ref 3.5–5.2)
Alk Phos: 46 U/L (ref 35–105)
Anion Gap: 11 (ref 7–16)
Bilirubin,Total: 0.3 mg/dL (ref 0.0–1.2)
CO2: 28 mmol/L (ref 20–28)
Calcium: 8.7 mg/dL — ABNORMAL LOW (ref 8.8–10.2)
Chloride: 104 mmol/L (ref 96–108)
Creatinine: 0.67 mg/dL (ref 0.51–0.95)
GFR,Black: 129 *
GFR,Caucasian: 112 *
Glucose: 106 mg/dL — ABNORMAL HIGH (ref 60–99)
Lab: 13 mg/dL (ref 6–20)
Potassium: 3.9 mmol/L (ref 3.3–5.1)
Sodium: 143 mmol/L (ref 133–145)
Total Protein: 6.7 g/dL (ref 6.3–7.7)

## 2016-02-09 LAB — CBC AND DIFFERENTIAL
Baso # K/uL: 0 10*3/uL (ref 0.0–0.1)
Basophil %: 0.8 %
Eos # K/uL: 0.2 10*3/uL (ref 0.0–0.4)
Eosinophil %: 4.3 %
Hematocrit: 40 % (ref 34–45)
Hemoglobin: 12.5 g/dL (ref 11.2–15.7)
IMM Granulocytes #: 0 10*3/uL (ref 0.0–0.1)
IMM Granulocytes: 0.3 %
Lymph # K/uL: 1.7 10*3/uL (ref 1.2–3.7)
Lymphocyte %: 41.8 %
MCH: 28 pg/cell (ref 26–32)
MCHC: 32 g/dL (ref 32–36)
MCV: 87 fL (ref 79–95)
Mono # K/uL: 0.4 10*3/uL (ref 0.2–0.9)
Monocyte %: 9.1 %
Neut # K/uL: 1.7 10*3/uL (ref 1.6–6.1)
Nucl RBC # K/uL: 0 10*3/uL (ref 0.0–0.0)
Nucl RBC %: 0 /100 WBC (ref 0.0–0.2)
Platelets: 265 10*3/uL (ref 160–370)
RBC: 4.6 MIL/uL (ref 3.9–5.2)
RDW: 13 % (ref 11.7–14.4)
Seg Neut %: 43.7 %
WBC: 4 10*3/uL (ref 4.0–10.0)

## 2016-02-09 LAB — LIPID PANEL
Chol/HDL Ratio: 3.9
Cholesterol: 133 mg/dL
HDL: 34 mg/dL
LDL Calculated: 86 mg/dL
Non HDL Cholesterol: 99 mg/dL
Triglycerides: 66 mg/dL

## 2016-02-09 LAB — TSH: TSH: 1.19 u[IU]/mL (ref 0.27–4.20)

## 2016-02-09 LAB — T4, FREE: Free T4: 1.4 ng/dL (ref 0.9–1.7)

## 2016-02-10 LAB — HEMOGLOBIN A1C: Hemoglobin A1C: 5.8 % (ref 4.0–6.0)

## 2016-02-12 LAB — VITAMIN D
25-OH VIT D2: <4 ng/mL
25-OH VIT D3: 13 ng/mL
25-OH Vit Total: 13 ng/mL — ABNORMAL LOW (ref 30–60)

## 2017-01-21 NOTE — L&D Delivery Note (Signed)
Delivery Summary Note  Patient: Courtney Lambert  Age: 37 y.o.  Date of Birth: [birth year]  [id number]  MRN: [id number]    Admission Summary  Date and time of admission: 11/03/2017 12:51 PM Attending Provider: Webb Silversmith, MD    Active Hospital Problems    Diagnosis    *!*S/P repeat low transverse C-section    Breech presentation     Allergies:   Patient has no known allergies (drug, envir, food or latex).  Weight:          OB History   Gravida Para Term Preterm AB Living   12 12 12  0 0 10   SAB TAB Ectopic Multiple Live Births   0 0 0 0 11      Breast or Formula Feeding: Breast feeding, Formula feeding      Prenatal Labs  ABO RH Blood Type   Date Value Ref Range Status   11/03/2017 O RH POS  Final     Antibody Screen   Date Value Ref Range Status   11/03/2017 Negative  Final     Rubella IgG AB   Date Value Ref Range Status   07/25/2017 POSITIVE  Final     Comment:     TEST METHOD: Multiplex flow immunoassay     HBV S Ag   Date Value Ref Range Status   07/25/2017 NEG  Final     Comment:     Test Method: CMIA     Group B Strep Culture   Date Value Ref Range Status   10/31/2017 .  Final     HIV 1&2 ANTIGEN/ANTIBODY   Date Value Ref Range Status   07/25/2017 Nonreactive  Final     Comment:     Test Method: CMIA      Dating Information  No LMP recorded. EDD: 11/18/2017, by Ultrasound  Information for the patient's newborn:  Kloey, Cazarez Girl [[id number]]     Delivery Information  Girl Gelinas  Sex: female Gestational Age: [redacted]w[redacted]d MRN: [id number] PCP: Unknown, Provider   Delivery Date/Time: 11/03/2017 5:44 PM   Time of Head Delivery: 11/03/2017  5:44 PM  Delivery Type: C-Sec, Low Transverse        Meconium at time of delivery: none  Delivery Location: 314 OR    Labor Onset Date/Time:     Dilation Complete Date/Time:         Preterm labor: No Antenatal steroids: None Antibiotics received during labor: No      First Cervical ripening date/time:     None1 Cervical ripening Type:          Rupture Date: 11/03/2017 Rupture Time: 6:00 AM  Details:    Rupture Type: Spontaneous Color:   Amount:   Fluid odor:   Induction: .none           Augmentation: .None      Labor complications: None     Providers    Delivering clinician:  Webb Silversmith, MD   Other personnel:   Provider Role   Mosetta Pigeon, MD OB Resident   Ara Kussmaul, RN Registered Nurse   Levie Heritage, RN Registered Nurse   Floyce Stakes, NP NICU/SCN NP   Peri Jefferson Tiffany Kocher, MD NICU/SCN Resident   Alonza Smoker, RN NICU/SCN RN           Anesthesia Method: Spinal-   Analgesics:        Presentation:   Position:  Prophylactic Maneuver: No     Shoulder Dystocia: No                                                                                             Skin to Skin/Bonding:             Reason skin to skin not initiated: Infant condition at delivery [102]   Resuscitation: Dry;Tactile Stimulation;Bulb Suctioning     Living Status: Living             APGARs Total Color Reflex irritability Breath Heart Rate Muscle Tone Assigned By   (greater than 7 no need for next measurement)   1 min 6  0  2  1  1  2   NICU   5 min 9  1  2  2  2  2   NICU   10 min                 15 min                 20 min                 25 min                 30 min                   Birth Weight: 2850 g (6 lb 4.5 oz) Height: 19" Head Circumference: 34 cm Observed Anomalies:  None   Cord: 3 Vessels     Complications: NONE            Clamping Delayed: 0  Clamped Date/Time:11/03/2017  5:44 PM  Cord blood disposition: Discard     Gases sent: Yes       Stem cell collection -by MD-: No   Maternal Info:   Placenta Delivery Date/Time: 11/03/2017  5:46 PM     Removal: C-Section Removal     Appearance: Intact       Disposition: discarded  Bonding:     Stages of Labor:          Stage One:   h   m          Stage Two:   h   m          Stage Three:  0h  26m  Episiotomy: None      Lacerations: None                                        Vaginal Hematoma:No.          Procedures: None                   Intraprocedure I/O Totals     None         37 year old G12 [id number], now [id number] who presented at [redacted]w[redacted]d for delivery for [facility] 4/8. Of note, she was recently admitted to [facility] from 10/11-10/12 for equivocal NST BPP 4/8. Induction was recommended, and she  had a cervical foley placed and was subsequently found to have fetal breech presentation. She had a reassuring tracing overnight and repeat BPP 8/8, and she was discharged with plan for outpatient delivery planning.     She was seen for her prenatal visit on the day of presentation, and had a repeat BPP 4/8, and was again referred to [facility] for delivery. She was also found to have SROM upon arrival. Pregnancy risks include single elevated BP and hx of LTCS x 3 (with a successful VBAC). Upon arrival, she had extensive counseling about risks of TOLAC vs rLTCS. She strongly desired possible vaginal delivery and opted for attempted [facility] with spinal anesthesia. Three attempts were made, however, EVC was unsuccessful, and the decision was made to proceed to rLTCS. She received ancef and azithromycin for antibiotic prophylaxis. Delivery of a viable female infant in the breech position, weighing [phone] with APGARs 6 & 9. No nuchal cord. Placenta delivered manually, noted to be intact with 3-VC. EBL 600. See operative note for details.     Keene Breath, MD  Ob/Gyn R3  [phone] 2:51 AM 11/04/2017

## 2017-07-14 ENCOUNTER — Other Ambulatory Visit
Admission: RE | Admit: 2017-07-14 | Discharge: 2017-07-14 | Disposition: A | Discharge status: Home / Self Care | Payer: Medicaid Other | Source: Ambulatory Visit | Attending: Emergency Medicine | Admitting: Emergency Medicine

## 2017-07-14 DIAGNOSIS — Z3201 Encounter for pregnancy test, result positive: Secondary | ICD-10-CM | POA: Insufficient documentation

## 2017-07-14 LAB — DATE/TIME NOT PROVIDED

## 2017-07-15 LAB — AEROBIC CULTURE: Aerobic Culture: 0

## 2017-07-16 LAB — DRUG SCREEN PANEL, EMERGENCY
Amphetamine,UR: NEGATIVE
Barbiturate,UR: NEGATIVE
Benzodiazepinen,UR: NEGATIVE
Cocaine/Metab,UR: NEGATIVE
Opiates,UR: NEGATIVE
Propoxyphene,UR: NEGATIVE
THC Metabolite,UR: NEGATIVE

## 2017-07-21 ENCOUNTER — Other Ambulatory Visit
Admission: RE | Admit: 2017-07-21 | Discharge: 2017-07-21 | Disposition: A | Discharge status: Home / Self Care | Payer: Medicaid Other | Source: Ambulatory Visit | Attending: Primary Care | Admitting: Primary Care

## 2017-07-21 ENCOUNTER — Other Ambulatory Visit: Payer: Self-pay | Admitting: Family Medicine

## 2017-07-21 DIAGNOSIS — Z3A19 19 weeks gestation of pregnancy: Secondary | ICD-10-CM | POA: Insufficient documentation

## 2017-07-21 DIAGNOSIS — Z3490 Encounter for supervision of normal pregnancy, unspecified, unspecified trimester: Secondary | ICD-10-CM

## 2017-07-21 LAB — DRUG SCREEN CHEMICAL DEPENDENCY, URINE
Amphetamine,UR: NEGATIVE
Benzodiazepinen,UR: NEGATIVE
Cocaine/Metab,UR: NEGATIVE
Opiates,UR: NEGATIVE
THC Metabolite,UR: NEGATIVE

## 2017-07-22 LAB — AEROBIC CULTURE: Aerobic Culture: 0

## 2017-07-25 ENCOUNTER — Other Ambulatory Visit
Admission: RE | Admit: 2017-07-25 | Discharge: 2017-07-25 | Disposition: A | Discharge status: Home / Self Care | Payer: Medicaid Other | Source: Ambulatory Visit | Attending: Primary Care | Admitting: Primary Care

## 2017-07-25 ENCOUNTER — Encounter: Payer: Self-pay | Admitting: Gastroenterology

## 2017-07-25 ENCOUNTER — Other Ambulatory Visit: Payer: Medicaid Other

## 2017-07-25 DIAGNOSIS — Z3A19 19 weeks gestation of pregnancy: Secondary | ICD-10-CM | POA: Insufficient documentation

## 2017-07-25 LAB — TYPE AND SCREEN FOR PNP
ABO RH Blood Type: O POS
Antibody Screen: NEGATIVE

## 2017-07-26 LAB — HIV 1&2 ANTIGEN/ANTIBODY: HIV 1&2 ANTIGEN/ANTIBODY: NONREACTIVE

## 2017-07-27 LAB — PRENATAL PROFILE
Baso # K/uL: 0 10*3/uL (ref 0.0–0.1)
Basophil %: 0.2 %
Eos # K/uL: 0.2 10*3/uL (ref 0.0–0.4)
Eosinophil %: 3.6 %
HBV S Ag: NEGATIVE
Hematocrit: 35 % (ref 34–45)
Hemoglobin: 11.6 g/dL (ref 11.2–15.7)
IMM Granulocytes #: 0 10*3/uL
IMM Granulocytes: 0.5 %
Lymph # K/uL: 1.6 10*3/uL (ref 1.2–3.7)
Lymphocyte %: 27 %
MCH: 30 pg/cell (ref 26–32)
MCHC: 33 g/dL (ref 32–36)
MCV: 89 fL (ref 79–95)
Mono # K/uL: 0.6 10*3/uL (ref 0.2–0.9)
Monocyte %: 9.8 %
Neut # K/uL: 3.6 10*3/uL (ref 1.6–6.1)
Nucl RBC # K/uL: 0 10*3/uL (ref 0.0–0.0)
Nucl RBC %: 0 /100 WBC (ref 0.0–0.2)
Platelets: 266 10*3/uL (ref 160–370)
RBC: 3.9 MIL/uL (ref 3.9–5.2)
RDW: 12.9 % (ref 11.7–14.4)
Rubella IgG AB: POSITIVE
Seg Neut %: 58.9 %
Syphilis Screen: NEGATIVE
Syphilis Status: NONREACTIVE
WBC: 6 10*3/uL (ref 4.0–10.0)

## 2017-07-27 LAB — VARICELLA ZOSTER IGG AB: VZV IgG: POSITIVE

## 2017-07-28 LAB — LEAD VENOUS: Lead,Venous: <1 ug/dl (ref 0–5)

## 2017-07-28 LAB — LEAD, BLOOD

## 2017-07-31 ENCOUNTER — Ambulatory Visit: Payer: Medicaid Other | Attending: Family Medicine

## 2017-07-31 ENCOUNTER — Other Ambulatory Visit: Payer: Self-pay | Admitting: Family Medicine

## 2017-07-31 DIAGNOSIS — Z3687 Encounter for antenatal screening for uncertain dates: Secondary | ICD-10-CM | POA: Insufficient documentation

## 2017-07-31 DIAGNOSIS — O99212 Obesity complicating pregnancy, second trimester: Secondary | ICD-10-CM | POA: Insufficient documentation

## 2017-07-31 DIAGNOSIS — Z3490 Encounter for supervision of normal pregnancy, unspecified, unspecified trimester: Secondary | ICD-10-CM

## 2017-07-31 DIAGNOSIS — Z6838 Body mass index (BMI) 38.0-38.9, adult: Secondary | ICD-10-CM | POA: Insufficient documentation

## 2017-07-31 DIAGNOSIS — O09522 Supervision of elderly multigravida, second trimester: Secondary | ICD-10-CM | POA: Insufficient documentation

## 2017-07-31 DIAGNOSIS — Z363 Encounter for antenatal screening for malformations: Secondary | ICD-10-CM | POA: Insufficient documentation

## 2017-08-22 ENCOUNTER — Ambulatory Visit: Payer: Self-pay | Attending: Family Medicine

## 2017-08-22 DIAGNOSIS — Z3490 Encounter for supervision of normal pregnancy, unspecified, unspecified trimester: Secondary | ICD-10-CM | POA: Insufficient documentation

## 2017-08-22 DIAGNOSIS — O99212 Obesity complicating pregnancy, second trimester: Secondary | ICD-10-CM | POA: Insufficient documentation

## 2017-08-22 DIAGNOSIS — O09522 Supervision of elderly multigravida, second trimester: Secondary | ICD-10-CM | POA: Insufficient documentation

## 2017-09-15 ENCOUNTER — Other Ambulatory Visit: Payer: Self-pay | Admitting: Family Medicine

## 2017-09-15 DIAGNOSIS — Z3490 Encounter for supervision of normal pregnancy, unspecified, unspecified trimester: Secondary | ICD-10-CM

## 2017-09-19 ENCOUNTER — Other Ambulatory Visit
Admission: RE | Admit: 2017-09-19 | Discharge: 2017-09-19 | Disposition: A | Discharge status: Home / Self Care | Payer: Self-pay | Source: Ambulatory Visit | Attending: Primary Care | Admitting: Primary Care

## 2017-09-19 DIAGNOSIS — Z3A30 30 weeks gestation of pregnancy: Secondary | ICD-10-CM | POA: Insufficient documentation

## 2017-09-19 LAB — CBC
Hematocrit: 35 % (ref 34–45)
Hemoglobin: 11.4 g/dL (ref 11.2–15.7)
MCH: 29 pg/cell (ref 26–32)
MCHC: 32 g/dL (ref 32–36)
MCV: 90 fL (ref 79–95)
Platelets: 244 10*3/uL (ref 160–370)
RBC: 3.9 MIL/uL (ref 3.9–5.2)
RDW: 12.5 % (ref 11.7–14.4)
WBC: 7.3 10*3/uL (ref 4.0–10.0)

## 2017-09-19 LAB — GLUCOSE TOLERANCE, 1 HOUR: Glucose,50gm 1HR: 77 mg/dL (ref 63–135)

## 2017-09-23 ENCOUNTER — Encounter: Payer: Self-pay | Admitting: Gastroenterology

## 2017-09-23 ENCOUNTER — Ambulatory Visit: Payer: Self-pay | Attending: Family Medicine

## 2017-09-23 DIAGNOSIS — O99213 Obesity complicating pregnancy, third trimester: Secondary | ICD-10-CM | POA: Insufficient documentation

## 2017-09-23 DIAGNOSIS — Z6838 Body mass index (BMI) 38.0-38.9, adult: Secondary | ICD-10-CM | POA: Insufficient documentation

## 2017-09-23 DIAGNOSIS — O26843 Uterine size-date discrepancy, third trimester: Secondary | ICD-10-CM | POA: Insufficient documentation

## 2017-09-23 DIAGNOSIS — Z3490 Encounter for supervision of normal pregnancy, unspecified, unspecified trimester: Secondary | ICD-10-CM | POA: Insufficient documentation

## 2017-09-23 DIAGNOSIS — O09523 Supervision of elderly multigravida, third trimester: Secondary | ICD-10-CM | POA: Insufficient documentation

## 2017-09-24 ENCOUNTER — Telehealth: Payer: Self-pay

## 2017-09-24 NOTE — Telephone Encounter (Signed)
Left message for patient to call back to schedule MFM consult.

## 2017-09-25 NOTE — Progress Notes (Signed)
Left second message for patient to call back to schedule MFM consult with [place].

## 2017-10-10 NOTE — Telephone Encounter (Signed)
[facility], stated patient had been called and letter sent home. Gave updated phone number to try. Writer called and phone rang twice then was disconnected.

## 2017-10-15 NOTE — Telephone Encounter (Signed)
Left VM for pt to move up consultation from 10/7 to 10/1.  Left name and number requesting pt call back.

## 2017-10-20 ENCOUNTER — Telehealth: Payer: Self-pay

## 2017-10-20 NOTE — Telephone Encounter (Signed)
Nurse Joni Reining from [facility] calling wanted to confirm appt for patient on 10/27/17. Explained that her appointment is scheduled for that day. We did attempt to move it up but was unable to get a hold of patient.

## 2017-10-26 ENCOUNTER — Encounter: Payer: Self-pay | Admitting: Obstetrics and Gynecology

## 2017-10-26 DIAGNOSIS — J45909 Unspecified asthma, uncomplicated: Secondary | ICD-10-CM | POA: Insufficient documentation

## 2017-10-26 DIAGNOSIS — Z98891 History of uterine scar from previous surgery: Secondary | ICD-10-CM | POA: Insufficient documentation

## 2017-10-26 DIAGNOSIS — O09529 Supervision of elderly multigravida, unspecified trimester: Secondary | ICD-10-CM | POA: Insufficient documentation

## 2017-10-26 DIAGNOSIS — O99519 Diseases of the respiratory system complicating pregnancy, unspecified trimester: Secondary | ICD-10-CM | POA: Insufficient documentation

## 2017-10-26 DIAGNOSIS — Z6839 Body mass index (BMI) 39.0-39.9, adult: Secondary | ICD-10-CM | POA: Insufficient documentation

## 2017-10-27 ENCOUNTER — Other Ambulatory Visit: Payer: Self-pay | Admitting: Obstetrics and Gynecology

## 2017-10-27 ENCOUNTER — Ambulatory Visit: Payer: MEDICAID | Attending: Obstetrics and Gynecology | Admitting: Obstetrics and Gynecology

## 2017-10-27 DIAGNOSIS — Z98891 History of uterine scar from previous surgery: Secondary | ICD-10-CM

## 2017-10-27 DIAGNOSIS — J45909 Unspecified asthma, uncomplicated: Secondary | ICD-10-CM

## 2017-10-27 DIAGNOSIS — O09529 Supervision of elderly multigravida, unspecified trimester: Secondary | ICD-10-CM

## 2017-10-27 DIAGNOSIS — O99513 Diseases of the respiratory system complicating pregnancy, third trimester: Secondary | ICD-10-CM

## 2017-10-27 DIAGNOSIS — Z3480 Encounter for supervision of other normal pregnancy, unspecified trimester: Secondary | ICD-10-CM

## 2017-10-27 NOTE — Progress Notes (Signed)
MATERNAL-FETAL MEDICINE CONSULT NOTE      Reason for Consult (Chief Complaint): desired TOLAC after hx CS x3  Consult requested by: Family Medicine [facility])    HPI     Courtney Lambert is a 37 y.o. G12P11-0-0-9 presenting for MFM consultation regarding desire for TOLAC with a history of prior CS x3.     Consult aided by use of over-the-phone Somali interpreter, however interpretation was unusually difficult, delayed, and possibly of poor quality. (ie, taking OB history alone took over 40 minutes)    Patient says she is doing well. She is a grandmultip with history of 3 prior CS, all performed at [facility] in [place], [place]. Prior to these CS's, she had several vaginal deliveries in [place]. She strongly desires to avoid additional surgeries and would like to [facility].    Relevant Medical History   No problems updated.   Asthma    Past Surgical History     Past Surgical History:   Procedure Laterality Date    CESAREAN SECTION, UNSPECIFIED  2014    CESAREAN SECTION, UNSPECIFIED  2015    CESAREAN SECTION, UNSPECIFIED  2009    HAND SURGERY         Obstetric / GYN History     OB History   Gravida Para Term Preterm AB Living   12 11 11  0 0 9   SAB TAB Ectopic Multiple Live Births           10      # Outcome Date GA Lbr Len/2nd Weight Sex Delivery Anes PTL Lv   12 Current            11 Term 66     CS-Unspec         Birth Comments: says baby wasn't moving, severe pain. [facility], [place], [place]   10 Term 2014     C-Sec, Unspe         Birth Comments: says baby wasn't moving, had to have CS - [facility], [place], [place]   9 Term 2011     VBAC   LIV   8 Term 2010     VBAC   LIV   7 Term 2009     CS-Unspec   FD   6 Term 2007     Vag-Spont   LIV   5 Term 2006     Vag-Spont   LIV   4 Term 2004     Vag-Spont   LIV   3 Term 2002     Vag-Spont   LIV   2 Term 1996     Vag-Spont      1 Term 1995    M Vag-Spont             Allergies   No Known Allergies (drug, envir, food or latex)    Current Home  Medications     Prior to Admission medications    Medication Sig Start Date End Date Taking? Authorizing Provider   VENTOLIN HFA 108 (90 BASE) MCG/ACT inhaler  11/03/14   [provider]       Social History and Family History reviewed and updated in eRecord.    Denies alcohol, tobacco, drugs    Consult and Plan     Courtney Lambert is a 37 y.o. G12P11-0-0-9 presenting for MFM consultation regarding desire for TOLAC with a history of prior CS x3.     Hx CS x3:  - we discussed the risks of TOLAC, including  that of uterine rupture requiring emergent cesarean section. This would also impose risk of fetal death or hemorrhage. We also discussed that due to her surgical history and higher likelihood of intra-abdominal scar tissue, an emergent cesarean section may be be relatively slower than usual to ensure safety, and that there is a higher likelihood of damage to surrounding structures. We reviewed that there is not a great deal of data regarding safety of TOLAC after CS x3, because not many patients attempt it. She understands that we do not recommend attempting TOLAC with a history of prior CS x3. We discussed not inducing labor due to higher risk of rupture.   - after our discussion, she refuses repeat cesarean and would like to attempt TOLAC.  - will assume care of patient at [facility].  - placenta is anterior, mid  - from patient-reported obstetric history and reasons for CS, suspect they have been low transverse. However, will obtain operative records from [facility] in [place], [place] to confirm.    Hx FDIU:  - unclear reasons why. Patient reports having a term fetal demise and says they had to perform a CS because she was sick. She does not provide many details, but denies having HTN.  - will start weekly NSTs. [place] ordered.    Asthma:   - reports stable with inhaler use    Again, interpretation was particularly difficult and time-consuming during this consult (likely related to this particular interpreter). At  her next visit, recommend obtaining more information regarding patient's asthma. Her med rec also includes prednisone - please clarify why she is taking this medicine and if she will necessitate use of stress dose steroids around delivery. Also address if patient would like BTL in the event of CS, or other form of PPBC.     Thank you for your kind referral and allowing [facility] to consult on your patient.  We are happy to see assume care.    Additional consultations (physician visits, genetic counseling, ultrasounds, fetal surveillance) or a transfer of care can be arranged by contacting the [facility] at [phone].    Approximately 60 minutes were spent in consultation with this patient; >50% spent on counseling and coordination of care.      Lanae Crumbly, MD  Maternal-Fetal Medicine

## 2017-10-28 ENCOUNTER — Telehealth: Payer: Self-pay

## 2017-10-28 NOTE — Telephone Encounter (Signed)
Spoke with medical records at [facility], Will have delivery notes and OP notes faxed over.

## 2017-10-29 ENCOUNTER — Other Ambulatory Visit: Payer: Self-pay

## 2017-10-31 ENCOUNTER — Inpatient Hospital Stay
Admission: AD | Admit: 2017-10-31 | Discharge: 2017-11-01 | DRG: 566 | Disposition: A | Discharge status: Home / Self Care | Payer: MEDICAID | Source: Ambulatory Visit | Attending: Obstetrics and Gynecology | Admitting: Obstetrics and Gynecology

## 2017-10-31 ENCOUNTER — Ambulatory Visit: Payer: MEDICAID

## 2017-10-31 DIAGNOSIS — E669 Obesity, unspecified: Secondary | ICD-10-CM | POA: Diagnosis present

## 2017-10-31 DIAGNOSIS — O34211 Maternal care for low transverse scar from previous cesarean delivery: Secondary | ICD-10-CM

## 2017-10-31 DIAGNOSIS — O99513 Diseases of the respiratory system complicating pregnancy, third trimester: Secondary | ICD-10-CM | POA: Diagnosis present

## 2017-10-31 DIAGNOSIS — Z6838 Body mass index (BMI) 38.0-38.9, adult: Secondary | ICD-10-CM

## 2017-10-31 DIAGNOSIS — Z98891 History of uterine scar from previous surgery: Secondary | ICD-10-CM

## 2017-10-31 DIAGNOSIS — J45909 Unspecified asthma, uncomplicated: Secondary | ICD-10-CM | POA: Diagnosis present

## 2017-10-31 DIAGNOSIS — Z3A37 37 weeks gestation of pregnancy: Secondary | ICD-10-CM

## 2017-10-31 DIAGNOSIS — O321XX0 Maternal care for breech presentation, not applicable or unspecified: Secondary | ICD-10-CM | POA: Diagnosis present

## 2017-10-31 DIAGNOSIS — Z9289 Personal history of other medical treatment: Secondary | ICD-10-CM

## 2017-10-31 DIAGNOSIS — O09523 Supervision of elderly multigravida, third trimester: Secondary | ICD-10-CM

## 2017-10-31 DIAGNOSIS — O09529 Supervision of elderly multigravida, unspecified trimester: Secondary | ICD-10-CM

## 2017-10-31 DIAGNOSIS — O99213 Obesity complicating pregnancy, third trimester: Secondary | ICD-10-CM | POA: Diagnosis present

## 2017-10-31 DIAGNOSIS — Z3490 Encounter for supervision of normal pregnancy, unspecified, unspecified trimester: Secondary | ICD-10-CM

## 2017-10-31 DIAGNOSIS — O09293 Supervision of pregnancy with other poor reproductive or obstetric history, third trimester: Secondary | ICD-10-CM

## 2017-10-31 LAB — CBC AND DIFFERENTIAL
Baso # K/uL: 0 10*3/uL (ref 0.0–0.1)
Basophil %: 0.3 %
Eos # K/uL: 0.2 10*3/uL (ref 0.0–0.4)
Eosinophil %: 2.4 %
Hematocrit: 36 % (ref 34–45)
Hemoglobin: 12 g/dL (ref 11.2–15.7)
IMM Granulocytes #: 0 10*3/uL
IMM Granulocytes: 0.2 %
Lymph # K/uL: 1.5 10*3/uL (ref 1.2–3.7)
Lymphocyte %: 23.4 %
MCH: 29 pg/cell (ref 26–32)
MCHC: 34 g/dL (ref 32–36)
MCV: 86 fL (ref 79–95)
Mono # K/uL: 0.5 10*3/uL (ref 0.2–0.9)
Monocyte %: 7.4 %
Neut # K/uL: 4.1 10*3/uL (ref 1.6–6.1)
Nucl RBC # K/uL: 0 10*3/uL (ref 0.0–0.0)
Nucl RBC %: 0 /100 WBC (ref 0.0–0.2)
Platelets: 217 10*3/uL (ref 160–370)
RBC: 4.2 MIL/uL (ref 3.9–5.2)
RDW: 12.4 % (ref 11.7–14.4)
Seg Neut %: 66.3 %
WBC: 6.2 10*3/uL (ref 4.0–10.0)

## 2017-10-31 LAB — TYPE AND SCREEN
ABO RH Blood Type: O POS
Antibody Screen: NEGATIVE

## 2017-10-31 MED ORDER — SODIUM CHLORIDE 0.9 % FLUSH FOR PUMPS *I*
0.0000 mL/h | INTRAVENOUS | Status: DC | PRN
Start: 2017-10-31 — End: 2017-11-01

## 2017-10-31 MED ORDER — OXYTOCIN 30 UNITS IN 500ML NS WRAPPED *I*
1.0000 m[IU]/min | INTRAMUSCULAR | Status: DC
Start: 2017-10-31 — End: 2017-11-01
  Filled 2017-10-31: qty 500

## 2017-10-31 MED ORDER — LACTATED RINGERS IV SOLN *I*
150.0000 mL/h | INTRAVENOUS | Status: DC
Start: 2017-10-31 — End: 2017-11-01

## 2017-10-31 MED ORDER — DEXTROSE 5 % FLUSH FOR PUMPS *I*
0.0000 mL/h | INTRAVENOUS | Status: DC | PRN
Start: 2017-10-31 — End: 2017-11-01

## 2017-10-31 MED ORDER — LACTATED RINGERS IV BOLUS *I*
1000.0000 mL | INTRAVENOUS | Status: DC | PRN
Start: 2017-10-31 — End: 2017-11-01

## 2017-10-31 NOTE — Progress Notes (Signed)
OBSTETRICS PROGRESS NOTE       Subjective     Difficulty tracing infant on monitor. [place] used and patient was found to be footling breech. Discussed with the patient using the interpreter line regarding the change in fetal presentation and the need to stop the induction. Patient is agreeable to the plan to wait overnight with monitoring. Will reevaluate with formal BPP in the morning.     Objective     Vitals:    10/31/17 1900 10/31/17 2000 10/31/17 2100 10/31/17 2200   BP: 117/69 122/66 117/69 122/70   Pulse: 76 78 74 73   Resp: 18 18 18 18    Temp:  36.5 C (97.7 F)     TempSrc:  Temporal         Most recent cervical exam:   OB Examiner: Jhade Berko MD (10/31/17 2128)  Dilation: Fingertip  Effacement (%): 40  Station: -2    Membranes:   Membrane Status: Intact         Fetal Monitoring:  Baseline:135 bpm  Variability: moderate  Accelerations: present  Decelerations: None   Category: I  Toco: no CTX    Assessment & Plan     Courtney Lambert is a 37 y.o. [id number] at [redacted]w[redacted]d with BPP 4/10 admitted for IOL.    1. FHT: Category I. Interventions: CEFM.  2. Labor assessment: Not in labor. Reevaluated and was footling breech. Plan to do CEFM overnight. Formal BPP in the morning. NPO at night for possible C/S tomorrow morning if BPP is not improved as compated to BPP on admission.   3. Pain management: not requesting  4. GBS status: unknown, GBS collected  5. Labor Risks:     History of 3 prior cesarean sections - In 2009, 2014, 2015  -TOLAC and C/S consent signed    History of Prior Stillbirth - 2009    Asthma  - Ventolin      Obesity    GBS unknown  -no history of neonatal sepsis    Rande Brunt, MD  OBGYN Resident PGY1    Pager [phone]

## 2017-10-31 NOTE — Progress Notes (Addendum)
OBSTETRICS PROGRESS NOTE       Subjective     Patient is doing well resting in bed on side. Not feeling significant contractions. Consented for C/S and TOLAC.    Patient does not want BTL.    Objective     Vitals:    10/31/17 1356 10/31/17 1500   BP: 139/82    Pulse: 66 70   Resp: 18 18   Temp: 36.4 C (97.5 F)    TempSrc: Temporal        Most recent cervical exam:   OB Examiner: Burhans (10/31/17 1600)  Dilation: Fingertip  Effacement (%): 40    Membranes:   Membrane Status: Intact         Fetal Monitoring:  Baseline: 150 bpm  Variability: moderate  Accelerations: present  Decelerations: None  Category: I  Toco: occasional contraction      Assessment & Plan     Courtney Lambert is a 37 y.o. [id number] at [redacted]w[redacted]d with BPP 4/10 admitted for IOL.    1. FHT: Category I. Interventions: CEFM.  2. Labor assessment: Start pitocin augmentation and plan for FB placement.  3. Pain management: does not want anything at this time.  4. GBS status: unknown, plan to collect GBS  5. Labor Risks:     History of 3 prior cesarean sections - In 2009, 2014, 2015  -TOLAC and C/S consent signed    History of Prior Stillbirth - 2009    Asthma  - Ventolin      Obesity    GBS unknown  -no history of neonatal sepsis    Rande Brunt, MD  OBGYN Resident PGY1    Pager [phone]    R4 addendum:  Agree with above note. Pitocin for labor augmentation with FB placement.    Malachi Pro, MD  Ob/Gyn Resident, R4  Pager [phone]

## 2017-10-31 NOTE — Procedures (Signed)
Fetal Non-Stress Test  Patient: Courtney Lambert  Age: 37 y.o.  Date of Birth: [birth year]  ZOX:[id number]  MRN: [id number]  GA: [redacted]w[redacted]d    Reason for exam: AMA, prior LTCS X3, U/S not reflective of LMP     Maternal   Heart Rate: 70 (10/31/17 1149)  BP: 122/80 (10/31/17 1149)    Education Done:   External fetal monitoring: yes   Fetal kick counts: yes   NST: yes   VAS: yes   Manual stimulation:no    Other patient declined phone interpreter for NST.    Fetal non-stress test for singleton pregnancy  Pre Procedure Diagnosis: Advanced maternal age, primigravida, third trimester  Post Procedure Diagnosis: NST (non-stress test) reactive            NST Start Time:  10/31/2017 11:41 AM            Uterine Irritability: No            Contractions: rare    NST Fetus A  Variability: moderate  FHR Baseline: 150  Decelerations: none   Accelerations: at least 10 BPM for 10 seconds  Stimulation: none  NST Interpretation: nonreactive    End Time:  10/31/2017 12:06 PM          Duration of test (min): 25  Location of NST Fetal Heart Tracing: Archived electronically  Reviewed with:  Dr. Virgia Land  Interpreting Provider Recommendations:  Suggest further immediate assessment of fetal well being.  Comments:  BPP per Dr. Virgia Land.        Test performed By: Claudean Severance, RN  10/31/2017 11:51 AM  Fetal non-stress test for singleton pregnancy  Pre Procedure Diagnosis: Advanced maternal age, primigravida, third trimester  Post Procedure Diagnosis: NST (non-stress test) reactive            NST Start Time:  10/31/2017 11:41 AM            Uterine Irritability: No            Contractions: rare    NST Fetus A  Variability: moderate  FHR Baseline: 150  Decelerations: none   Accelerations: at least 10 BPM for 10 seconds  Stimulation: none  NST Interpretation: nonreactive    End Time:  10/31/2017 12:06 PM          Duration of test (min): 25  Location of NST Fetal Heart Tracing: Archived electronically  Reviewed with:  Dr. Virgia Land  Interpreting Provider  Recommendations:  Suggest further immediate assessment of fetal well being.  Comments:  BPP per Dr. Virgia Land.

## 2017-10-31 NOTE — H&P (Signed)
OBSTETRICS ADMISSION HISTORY & PHYSICAL    Delayed entry from 10/11    Primary OB-GYN: Family Medicine    Reason for Admission (Chief Complaint): Sent to triage for equivocal NST and BPP 4/8    HPI      Courtney Lambert is a 37 y.o. [id number] at 39w3dby midtrimester ultrasound with pregnancy complicated by risks outlined below who presents after having an equivocal NST with BPP 4/8. She reports feeling fetal movement earlier today although not as much this afternoon.She denies contractions, vaginal bleeding, LOF, shortness of breath, chest pain, headache, change in vision, RUQ pain. She reports mild lower back pain and bilateral hip pain which she states is chronic in nature. She desires TOLAC. She has had 3 prior LTCS- the first was a demise. She then had 2 VBAC deliveries [place], One of her daughters died at age 39     Pregnancy Risks     History of 3 prior cesarean sections - In 2009, 2014, 2015    History of Prior Stillbirth - 2009    Asthma  - Ventolin      Obesity      Past Medical History     Past Medical History:   Diagnosis Date    Asthma     Obesity (BMI 35.0-39.9 without comorbidity)        Past Surgical History     Past Surgical History:   Procedure Laterality Date    CESAREAN SECTION, UNSPECIFIED  2014    CESAREAN SECTION, UNSPECIFIED  2015    CESAREAN SECTION, UNSPECIFIED  2009    HAND SURGERY         Obstetrical History     OB History   Gravida Para Term Preterm AB Living   _0 0 0 9   SAB TAB Ectopic Multiple Live Births           10      # Outcome Date GA Lbr Len/2nd Weight Sex Delivery Anes PTL Lv   12 Current            11 Term 226    CS-Unspec         [facility] [place]  [place]     C-Sec, Unspe         [facility] Comments: [facility] wasn't moving[facility] [place]  9 Term 2011     VBAC   LIV   8 Term 2010     VBAC   LIV    7 Term 2009     CS-Unspec   FD   6 Term 2007     Vag-Spont   LIV   5 Term 2006     Vag-Spont   LIV   4 Term 2004     Vag-Spont   LIV   3 Term 2002     Vag-Spont   LIV   2 Term 1996     Vag-Spont      1 Term 1995    M Vag-Spont             Allergies   No Known Allergies (drug, envir, food or latex)    Current Home Medications     Prior to Admission medications    Medication Sig Start Date End Date Taking? Authorizing Provider   VENTOLIN HFA 108 (  90 BASE) MCG/ACT inhaler  11/03/14   [provider]       GYN History, Social History, and Family History reviewed and updated in eRecord.      Review of Systems     Constitutional: Negative for fever or fatigue.  Psych: Denies depressive symptoms or anxiety.  Cardiovascular: Denies chest pain or palpitations.  Respiratory: Denies shortness of breath or orthopnea.  Gastrointestinal: Denies nausea/vomiting.  denies abdominal pain.  Denies RUQ pain or flank pain.   Musculoskeletal: Denies muscle weakness.  Skin/Extremities: denies peripheral edema.    Neurologic: denies headaches or vision changes.  Denies syncope.  Denies recent falls.  Genitourinary: Denies dysuria.  Denies urinary frequency or urgency.  Denies hematuria. Denies vaginal bleeding.  Denies leakage of fluid. Denies vaginal discharge.Denies contractions.          Prenatal Labs     No results for input(s): WBC, HGB, HCT, PLT in the last 168 hours.  No results for input(s): NA, K, CL, CO2, UN, CREAT, GFRC, GLU in the last 168 hours. No results for input(s): LD, URIC, ALT, AST, ALK, TB in the last 168 hours.   No results for input(s): UTPR, UCRR in the last 168 hours.    Urine spot P/C ratio:  No results for input(s): TPCREATRATIO in the last 8760 hours.          Lab results: 07/25/17  1530   ABO RH Blood Type O RH POS   Rubella IgG AB POSITIVE   Syphilis Screen Neg   HIV 1&2 ANTIGEN/ANTIBODY Nonreactive   HBV S Ag NEG        Lab results: 09/19/17  1703   Glucose,50gm 1HR 77      No results for input(s):  GL0, GL1H, GL2H, [place] in the last 8760 hours.        Prenatal Ultrasound Review (Pertinent Findings):  09/23/2017: EFW 47%ile.       Physical Exam     Vitals:    10/31/17 1356 10/31/17 1500   BP: 139/82    Pulse: 66 70   Resp: 18 18   Temp: 36.4 C (97.5 F)    TempSrc: Temporal        Per Dr. Skip Mayer:  General Appearance: healthy, alert and no distress  Mental Status: Alert, Oriented and Alert and oriented x 3  Cardiovascular: normal rate, regular rhythm  Respiratory: No labored breathing at rest  Abdomen: Soft, gravid, non-tender. No tenderness at RUQ  Uterus: Gravid. Non-tender to palpation.  Extremities: trace edema    External Genitalia: Unremarkable external genitalia without lesions.  Normal appearing labia majora/minora and introitus.  Vagina: Pink, ruggated vaginal mucosa without lesions.  Minimal clear discharge.  No bleeding.  Cervix: Cervical examination as per below.  No lesions.  No leakage of fluid.       Cervical Exam: fingertip/40%/high/soft    Presentation: vertex by ultrasound    Placental location: anterior      Fetal Monitoring:  Baseline:  160 bpm  Variability: moderate variability  Accelerations: Yes 15X15   Decelerations: not present  Category: CAT I  Toco: irritable    Of note: BPP is now 6/10  (NST reactive)    Assessment & Plan     Courtney Lambert is a 37 y.o. [id number] at 63w3dwith pregnancy complicated by risks outlined above admitted for non reassuring NST and BPP of 4/10 now 6/10. She desires a TOLAC after 3 prior cesarean deliveries. She is amenable to IOL with pitocin  and foley placement.     She is expecting a baby girl, plans to breastfeed, would like an epidural. She desires nexplanon for postpartum birth control.    Admit to Dr. Netta Cedars   - Insert IV   - CBC, T&S, and Syphilis screen sent on admission.   - Cervix: 1/thick/high    - Presentation: cephalic / EFW: 9450T   - Category I fetal heart tracing.  continuous EFM ordered.   - Due to grand multiparity and history of 3 prior  cesarean sections, risk of PPH is:  high        Labor Plan   - Plan for IOL with cervical foley and pitocin   - Maternal and fetal risks and benefits of delivery between 36 0/7 and 38 6/7 weeks were discussed with the mother. Patient understands the risks and benefits of delivery at < [redacted] weeks gestation and that the indication(s) of  BPP 6/10 makes delivery at this time appropriate.     Postpartum planning   - Rh positive / Rubella immune /HIV negative / GBS: pending      Elliot Gault, MD  Ob/Gyn Resident, [place]  Pager [phone]

## 2017-10-31 NOTE — Progress Notes (Signed)
Difficulty tracing FHR, Coulter MD aware.  Kreuger in to assist with U/S, baby found to be in breech presentation.  Blue phone used by providers to discuss plan of treatment with pt.  Foley bulb removed at 2151.  Amie Critchley, RN

## 2017-11-01 ENCOUNTER — Inpatient Hospital Stay: Payer: MEDICAID

## 2017-11-01 DIAGNOSIS — O368390 Maternal care for abnormalities of the fetal heart rate or rhythm, unspecified trimester, not applicable or unspecified: Secondary | ICD-10-CM

## 2017-11-01 LAB — SYPHILIS SCREEN
Syphilis Screen: NEGATIVE
Syphilis Status: NONREACTIVE

## 2017-11-01 MED ORDER — ACETAMINOPHEN 325 MG PO TABS *I*
650.0000 mg | ORAL_TABLET | Freq: Once | ORAL | Status: AC
Start: 2017-11-01 — End: 2017-11-01
  Administered 2017-11-01: 650 mg via ORAL
  Filled 2017-11-01: qty 2

## 2017-11-01 NOTE — Progress Notes (Signed)
Patient's tracing reactive at this time. Patient OK for discharge per phone call from Dr. Gershon Crane. Will proceed with patient's discharge now.      Waylan Rocher, RN

## 2017-11-01 NOTE — Discharge Instructions (Signed)
Reason for Visit: Fetal monitoring for abnormal BPP  Destination: Home  Mode: Ambulatory    Procedures done in triage: Fetal monitoring  Pending Laboratory Data: None    Diet: Regular    Activity: Regular activity    Special Instructions: Call or return to triage if you have worsening of any of your symptoms, if you have more than 6 contractions in an hour, if you have vaginal bleeding or leakage of clear fluid, if you have headache or vision changes, if you have right-sided pain, or if you don't feel the baby moving.      If you cannot reach your OB/Gyn or Midwife, call the Labor and Delivery Unit or 911 if it is an emergency.    Follow-up with your [facility] provider as previously instructed.

## 2017-11-01 NOTE — Progress Notes (Signed)
OBSTETRICS PROGRESS NOTE       Subjective     Continued difficulty tracing infant on monitor. USN used to locate baby still in breech position.     Objective     Vitals:    10/31/17 2100 10/31/17 2200 10/31/17 2300 11/01/17 0000   BP: 117/69 122/70  119/67   Pulse: 74 73  73   Resp: 18 18 18 18    Temp:       TempSrc:           Most recent cervical exam:   OB Examiner: Coulter MD (10/31/17 2128)  Dilation: Fingertip  Effacement (%): 40  Station: -2    Membranes:   Membrane Status: Intact         Fetal Monitoring - intermittent tracing:  Baseline:135 bpm  Variability: moderate  Accelerations: present  Decelerations: None   Category: I  Toco: no CTX    Assessment & Plan     Taelor is a 37 y.o. [id number] at [redacted]w[redacted]d with BPP 6/10 admitted for IOL now noted to be breech. Continuing CEFM overnight with formal USN in the morning.    1. FHT: Category I. Interventions: CEFM.  2. Labor assessment: Not in labor. Plan to do CEFM overnight. Formal BPP in the morning. NPO at night for possible C/S tomorrow morning if BPP is not improved as compated to BPP on admission.   3. Pain management: not requesting  4. GBS status: unknown, GBS collected  5. Labor Risks:     History of 3 prior cesarean sections - In 2009, 2014, 2015  -TOLAC and C/S consent signed    History of Prior Stillbirth - 2009    Asthma  - Ventolin      Obesity    GBS unknown  -no history of neonatal sepsis    Malachi Pro, MD  Ob/Gyn Resident, R4  Pager [phone]

## 2017-11-01 NOTE — Progress Notes (Signed)
OBSTETRICS PROGRESS NOTE       Sonographer performed a formal ultrasound this morning showing BPP 8/8.  With reactive NST BPP is 10/10.  Fetal presentation confirmed footling breech.    Based on the above antenatal testing in conjunction with fetal malpresentation with variable lie, plan to await delivery with close outpatient monitoring.      Follow up planned for next week for counseling regarding external cephalic version versus cesarean delivery for breech presentation.  Discussed that the baby may turn on its own and if vertex she is still a candidate for trial of labor after cesarean.    [place] interpreter used for discussion.  Patient is amenable for discharge home.  Return precautions reviewed for decreased fetal movement.     Patient has scheduled appointment on Monday.    D/w Dr. Erle Crocker, MD  OB/GYN Resident PGY-4  11/01/17 10:42 AM  Pager [phone]

## 2017-11-01 NOTE — Progress Notes (Signed)
OBSTETRICS PROGRESS NOTE       Subjective     In to meet patient. Resting comfortably. No ctx.     Objective     Vitals:    11/01/17 0600 11/01/17 0630 11/01/17 0700 11/01/17 0734   BP:    125/73   Pulse: 66 64 66 67   Resp:       Temp:       TempSrc:           Most recent cervical exam:   OB Examiner: Coulter MD (10/31/17 2128)  Dilation: Fingertip  Effacement (%): 40  Station: -2    Membranes:   Membrane Status: Intact         Fetal Monitoring - intermittent tracing:  Baseline:135 bpm  Variability: moderate  Accelerations: present  Decelerations: None   Category: I  Toco: no CTX    Assessment & Plan     Courtney Lambert is a 37 y.o. [id number] at [redacted]w[redacted]d with BPP 6/10 admitted for IOL now noted to be breech. Continuing CEFM overnight with formal USN later this AM    1. FHT: Category I. Interventions: CEFM.  2. Labor assessment: Not in labor. cEFM currently with plan for formal BPP later this AM. NPO for possible C/S tomorrow morning if BPP is not improved as compated to BPP on admission.   3. Pain management: not requesting  4. GBS status: unknown, GBS collected  5. Labor Risks:     History of 3 prior cesarean sections - In 2009, 2014, 2015  -TOLAC and C/S consent signed  - VB > C/S for decreased FM (demise) > repeat C/S > VBAC > c/s     Asthma  - Ventolin      Obesity    GBS unknown  -low risk at term so no prophylaxis    Fredricka Bonine, MD  OB/GYN Resident, PGY-1  Pager [phone]

## 2017-11-01 NOTE — Progress Notes (Signed)
OBSTETRICS PROGRESS NOTE       Subjective     Patient is complaining of a mild headache. Given tylenol for pain    Objective     Vitals:    11/01/17 0157 11/01/17 0200 11/01/17 0230 11/01/17 0418   BP: 120/68   135/74   Pulse: 75 75 70 68   Resp:       Temp:       TempSrc:           Most recent cervical exam:   OB Examiner: Courtney Elliston MD (10/31/17 2128)  Dilation: Fingertip  Effacement (%): 40  Station: -2    Membranes:   Membrane Status: Intact         Fetal Monitoring - intermittent tracing:  Baseline:135 bpm  Variability: moderate  Accelerations: present  Decelerations: None   Category: I  Toco: no CTX    Assessment & Plan     Courtney Lambert is a 37 y.o. [id number] at [redacted]w[redacted]d with BPP 6/10 admitted for IOL now noted to be breech. Continuing CEFM overnight with formal USN in the morning.    1. FHT: Category I. Interventions: CEFM.  2. Labor assessment: Not in labor. Plan to do CEFM overnight. Formal BPP in the morning. NPO at night for possible C/S tomorrow morning if BPP is not improved as compated to BPP on admission.   3. Pain management: not requesting  4. GBS status: unknown, GBS collected  5. Labor Risks:     History of 3 prior cesarean sections - In 2009, 2014, 2015  -TOLAC and C/S consent signed    History of Prior Stillbirth - 2009    Asthma  - Ventolin      Obesity    GBS unknown  -no history of neonatal sepsis    Courtney Brunt, MD  OBGYN Resident PGY1    Pager [phone]

## 2017-11-03 ENCOUNTER — Encounter: Payer: Self-pay | Admitting: Anesthesiology

## 2017-11-03 ENCOUNTER — Inpatient Hospital Stay: Payer: MEDICAID | Admitting: Anesthesiology

## 2017-11-03 ENCOUNTER — Inpatient Hospital Stay
Admission: AD | Admit: 2017-11-03 | Discharge: 2017-11-06 | DRG: 540 | Disposition: A | Discharge status: Home / Self Care | Payer: MEDICAID | Source: Ambulatory Visit | Attending: Obstetrics and Gynecology | Admitting: Obstetrics and Gynecology

## 2017-11-03 ENCOUNTER — Ambulatory Visit: Payer: Self-pay

## 2017-11-03 ENCOUNTER — Ambulatory Visit: Payer: MEDICAID | Admitting: Obstetrics and Gynecology

## 2017-11-03 ENCOUNTER — Encounter
Admission: AD | Disposition: A | Discharge status: Home / Self Care | Payer: Self-pay | Source: Ambulatory Visit | Attending: Obstetrics and Gynecology

## 2017-11-03 ENCOUNTER — Ambulatory Visit: Payer: MEDICAID

## 2017-11-03 VITALS — BP 140/70 | HR 81 | Wt 231.8 lb

## 2017-11-03 DIAGNOSIS — Z6838 Body mass index (BMI) 38.0-38.9, adult: Secondary | ICD-10-CM | POA: Insufficient documentation

## 2017-11-03 DIAGNOSIS — O0990 Supervision of high risk pregnancy, unspecified, unspecified trimester: Secondary | ICD-10-CM

## 2017-11-03 DIAGNOSIS — O34211 Maternal care for low transverse scar from previous cesarean delivery: Secondary | ICD-10-CM | POA: Insufficient documentation

## 2017-11-03 DIAGNOSIS — Z98891 History of uterine scar from previous surgery: Secondary | ICD-10-CM

## 2017-11-03 DIAGNOSIS — O321XX0 Maternal care for breech presentation, not applicable or unspecified: Secondary | ICD-10-CM | POA: Diagnosis present

## 2017-11-03 DIAGNOSIS — Z3480 Encounter for supervision of other normal pregnancy, unspecified trimester: Secondary | ICD-10-CM

## 2017-11-03 DIAGNOSIS — O4103X0 Oligohydramnios, third trimester, not applicable or unspecified: Secondary | ICD-10-CM

## 2017-11-03 DIAGNOSIS — Z3A37 37 weeks gestation of pregnancy: Secondary | ICD-10-CM

## 2017-11-03 DIAGNOSIS — O99213 Obesity complicating pregnancy, third trimester: Secondary | ICD-10-CM

## 2017-11-03 DIAGNOSIS — O09293 Supervision of pregnancy with other poor reproductive or obstetric history, third trimester: Secondary | ICD-10-CM

## 2017-11-03 DIAGNOSIS — O09523 Supervision of elderly multigravida, third trimester: Secondary | ICD-10-CM

## 2017-11-03 DIAGNOSIS — O163 Unspecified maternal hypertension, third trimester: Secondary | ICD-10-CM | POA: Insufficient documentation

## 2017-11-03 HISTORY — PX: PR EXTERNAL CEPHALIC VERSION W/WO TOCOLYSIS: 59412

## 2017-11-03 HISTORY — PX: PR ANTEPARTUM HEAD MANIPULATION: 59412

## 2017-11-03 LAB — CBC
Hematocrit: 35 % (ref 34–45)
Hemoglobin: 11.6 g/dL (ref 11.2–15.7)
MCH: 29 pg/cell (ref 26–32)
MCHC: 33 g/dL (ref 32–36)
MCV: 88 fL (ref 79–95)
Platelets: 224 10*3/uL (ref 160–370)
RBC: 4 MIL/uL (ref 3.9–5.2)
RDW: 12.8 % (ref 11.7–14.4)
WBC: 7.8 10*3/uL (ref 4.0–10.0)

## 2017-11-03 LAB — SYPHILIS SCREEN
Syphilis Screen: NEGATIVE
Syphilis Status: NONREACTIVE

## 2017-11-03 LAB — TYPE AND SCREEN
ABO RH Blood Type: O POS
Antibody Screen: NEGATIVE

## 2017-11-03 LAB — GROUP B STREP CULTURE: Group B Strep Culture: 0

## 2017-11-03 SURGERY — Surgical Case
Anesthesia: Spinal | Site: Abdomen | Wound class: Clean Contaminated

## 2017-11-03 MED ORDER — BUPIVACAINE IN DEXTROSE 0.75-8.25 % IT SOLN *I*
INTRATHECAL | Status: DC | PRN
Start: 2017-11-03 — End: 2017-11-03
  Administered 2017-11-03: 1.6 mL via INTRATHECAL

## 2017-11-03 MED ORDER — DIPHENHYDRAMINE HCL 25 MG PO TABS *I*
25.0000 mg | ORAL_TABLET | Freq: Every evening | ORAL | Status: DC | PRN
Start: 2017-11-03 — End: 2017-11-06

## 2017-11-03 MED ORDER — SODIUM CHLORIDE 0.9 % 100 ML IV SOLN *I*
12.5000 mg | Freq: Four times a day (QID) | INTRAVENOUS | Status: DC | PRN
Start: 2017-11-03 — End: 2017-11-06

## 2017-11-03 MED ORDER — KETOROLAC TROMETHAMINE 30 MG/ML IJ SOLN *I*
30.0000 mg | Freq: Four times a day (QID) | INTRAMUSCULAR | Status: AC
Start: 2017-11-03 — End: 2017-11-04
  Administered 2017-11-03 – 2017-11-04 (×4): 30 mg via INTRAVENOUS
  Filled 2017-11-03 (×4): qty 1

## 2017-11-03 MED ORDER — IBUPROFEN 600 MG PO TABS *I*
600.0000 mg | ORAL_TABLET | Freq: Three times a day (TID) | ORAL | 0 refills | Status: AC | PRN
Start: 2017-11-03 — End: ?

## 2017-11-03 MED ORDER — IBUPROFEN 600 MG PO TABS *I*
600.0000 mg | ORAL_TABLET | Freq: Three times a day (TID) | ORAL | Status: DC
Start: 2017-11-04 — End: 2017-11-06
  Administered 2017-11-04 – 2017-11-06 (×5): 600 mg via ORAL
  Filled 2017-11-03 (×5): qty 1

## 2017-11-03 MED ORDER — MISOPROSTOL 200 MCG PO TABS *I*
800.0000 ug | ORAL_TABLET | Freq: Once | ORAL | Status: AC
Start: 2017-11-03 — End: 2017-11-03
  Administered 2017-11-03: 800 ug via BUCCAL
  Filled 2017-11-03: qty 4

## 2017-11-03 MED ORDER — HYDROMORPHONE HCL 2 MG/ML IJ SOLN *WRAPPED*
0.5000 mg | INTRAMUSCULAR | Status: DC | PRN
Start: 2017-11-03 — End: 2017-11-03

## 2017-11-03 MED ORDER — CEFAZOLIN SODIUM 1000 MG IJ SOLR *I*
INTRAMUSCULAR | Status: DC | PRN
Start: 2017-11-03 — End: 2017-11-03
  Administered 2017-11-03: 2000 mg via INTRAVENOUS

## 2017-11-03 MED ORDER — LACTATED RINGERS IV BOLUS *I*
1000.0000 mL | INTRAVENOUS | Status: DC | PRN
Start: 2017-11-03 — End: 2017-11-03

## 2017-11-03 MED ORDER — OXYTOCIN 30 UNITS IN 500ML NS WRAPPED *I*
INTRAMUSCULAR | Status: AC
Start: 2017-11-03 — End: 2017-11-03
  Administered 2017-11-03: 150 m[IU]/min via INTRAVENOUS
  Filled 2017-11-03: qty 500

## 2017-11-03 MED ORDER — DEXTROSE 5 % FLUSH FOR PUMPS *I*
0.0000 mL/h | INTRAVENOUS | Status: DC | PRN
Start: 2017-11-03 — End: 2017-11-06

## 2017-11-03 MED ORDER — SODIUM CHLORIDE 0.9 % 100 ML IV SOLN *I*
6.2500 mg | Freq: Once | INTRAVENOUS | Status: DC | PRN
Start: 2017-11-03 — End: 2017-11-03

## 2017-11-03 MED ORDER — MORPHINE SULFATE *PF* 1 MG/ML IJ SOLN *I*
INTRAMUSCULAR | Status: AC
Start: 2017-11-03 — End: 2017-11-03
  Filled 2017-11-03: qty 10

## 2017-11-03 MED ORDER — SOD CITRATE-CITRIC ACID 500-334 MG/5ML PO SOLN *I*
ORAL | Status: DC | PRN
Start: 2017-11-03 — End: 2017-11-03
  Administered 2017-11-03: 30 mL via ORAL

## 2017-11-03 MED ORDER — HALOPERIDOL LACTATE 5 MG/ML IJ SOLN *I*
0.5000 mg | Freq: Once | INTRAMUSCULAR | Status: DC | PRN
Start: 2017-11-03 — End: 2017-11-03

## 2017-11-03 MED ORDER — DOCUSATE SODIUM 100 MG PO CAPS *I*
100.0000 mg | ORAL_CAPSULE | Freq: Two times a day (BID) | ORAL | Status: DC | PRN
Start: 2017-11-03 — End: 2017-11-05

## 2017-11-03 MED ORDER — AZITHROMYCIN 500MG IN 280ML D5W *I*
500.0000 mg | Freq: Once | INTRAVENOUS | Status: DC
Start: 2017-11-03 — End: 2017-11-03
  Filled 2017-11-03: qty 275

## 2017-11-03 MED ORDER — POLYETHYLENE GLYCOL 3350 PO PACK 17 GM *I*
17.0000 g | PACK | Freq: Every day | ORAL | Status: DC
Start: 2017-11-03 — End: 2017-11-06
  Administered 2017-11-04 – 2017-11-06 (×3): 17 g via ORAL
  Filled 2017-11-03 (×3): qty 17

## 2017-11-03 MED ORDER — FENTANYL CITRATE 50 MCG/ML IJ SOLN *WRAPPED*
INTRAMUSCULAR | Status: DC | PRN
Start: 2017-11-03 — End: 2017-11-03
  Administered 2017-11-03: 10 ug via INTRATHECAL

## 2017-11-03 MED ORDER — CEFAZOLIN 2000 MG IN STERILE WATER 20ML SYRINGE *I*
2000.0000 mg | PREFILLED_SYRINGE | Freq: Once | INTRAVENOUS | Status: DC
Start: 2017-11-03 — End: 2017-11-03
  Filled 2017-11-03: qty 20

## 2017-11-03 MED ORDER — ONDANSETRON HCL 2 MG/ML IV SOLN *I*
1.0000 mg | Freq: Once | INTRAMUSCULAR | Status: DC | PRN
Start: 2017-11-03 — End: 2017-11-03

## 2017-11-03 MED ORDER — LACTATED RINGERS IV SOLN *I*
150.0000 mL/h | INTRAVENOUS | Status: DC
Start: 2017-11-03 — End: 2017-11-03

## 2017-11-03 MED ORDER — OXYCODONE HCL 10 MG PO TABS *I*
10.0000 mg | ORAL_TABLET | Freq: Four times a day (QID) | ORAL | Status: DC | PRN
Start: 2017-11-03 — End: 2017-11-06
  Administered 2017-11-03 – 2017-11-06 (×5): 10 mg via ORAL
  Filled 2017-11-03 (×6): qty 1

## 2017-11-03 MED ORDER — HYDROMORPHONE HCL 2 MG/ML IJ SOLN *WRAPPED*
0.2500 mg | Freq: Once | INTRAMUSCULAR | Status: AC | PRN
Start: 2017-11-03 — End: 2017-11-03
  Filled 2017-11-03: qty 1

## 2017-11-03 MED ORDER — OXYCODONE HCL 5 MG PO TABS *I*
5.0000 mg | ORAL_TABLET | Freq: Four times a day (QID) | ORAL | Status: DC | PRN
Start: 2017-11-03 — End: 2017-11-06
  Administered 2017-11-04 – 2017-11-06 (×2): 5 mg via ORAL
  Filled 2017-11-03 (×2): qty 1

## 2017-11-03 MED ORDER — ACETAMINOPHEN 500 MG PO TABS *I*
1000.0000 mg | ORAL_TABLET | Freq: Three times a day (TID) | ORAL | Status: DC
Start: 2017-11-04 — End: 2017-11-06
  Administered 2017-11-04 – 2017-11-06 (×8): 1000 mg via ORAL
  Filled 2017-11-03 (×8): qty 2

## 2017-11-03 MED ORDER — OXYCODONE HCL 5 MG/5ML PO SOLN *I*
5.0000 mg | Freq: Once | ORAL | Status: DC | PRN
Start: 2017-11-03 — End: 2017-11-03

## 2017-11-03 MED ORDER — OXYTOCIN 30 UNITS/500 ML NS *POSTPARTUM* *I*
100.0000 m[IU]/min | INTRAMUSCULAR | Status: AC
Start: 2017-11-03 — End: 2017-11-04
  Administered 2017-11-03 (×5): 150 m[IU]/min

## 2017-11-03 MED ORDER — FENTANYL CITRATE 50 MCG/ML IJ SOLN *WRAPPED*
INTRAMUSCULAR | Status: DC | PRN
Start: 2017-11-03 — End: 2017-11-03
  Administered 2017-11-03: 40 ug via INTRAVENOUS
  Administered 2017-11-03: 50 ug via INTRAVENOUS

## 2017-11-03 MED ORDER — SODIUM CHLORIDE 0.9 % FLUSH FOR PUMPS *I*
0.0000 mL/h | INTRAVENOUS | Status: DC | PRN
Start: 2017-11-03 — End: 2017-11-06

## 2017-11-03 MED ORDER — HYDROMORPHONE HCL 2 MG/ML IJ SOLN *WRAPPED*
0.2500 mg | Freq: Once | INTRAMUSCULAR | Status: AC | PRN
Start: 2017-11-03 — End: 2017-11-03
  Administered 2017-11-03: 0.26 mg via INTRAVENOUS

## 2017-11-03 MED ORDER — OXYCODONE HCL 5 MG PO TABS *I*
5.0000 mg | ORAL_TABLET | Freq: Four times a day (QID) | ORAL | 0 refills | Status: AC | PRN
Start: 2017-11-03 — End: ?

## 2017-11-03 MED ORDER — ACETAMINOPHEN 500 MG PO TABS *I*
1000.0000 mg | ORAL_TABLET | Freq: Three times a day (TID) | ORAL | 0 refills | Status: AC | PRN
Start: 2017-11-03 — End: ?

## 2017-11-03 MED ORDER — DOCUSATE SODIUM 100 MG PO CAPS *I*
100.0000 mg | ORAL_CAPSULE | Freq: Two times a day (BID) | ORAL | 0 refills | Status: AC | PRN
Start: 2017-11-03 — End: ?

## 2017-11-03 MED ORDER — FENTANYL CITRATE 50 MCG/ML IJ SOLN *WRAPPED*
INTRAMUSCULAR | Status: AC
Start: 2017-11-03 — End: 2017-11-03
  Filled 2017-11-03: qty 2

## 2017-11-03 MED ORDER — AZITHROMYCIN 500MG IN 280ML D5W *I*
INTRAVENOUS | Status: DC | PRN
Start: 2017-11-03 — End: 2017-11-03
  Administered 2017-11-03: 500 mg via INTRAVENOUS

## 2017-11-03 MED ORDER — OXYCODONE HCL 5 MG/5ML PO SOLN *I*
10.0000 mg | Freq: Once | ORAL | Status: DC | PRN
Start: 2017-11-03 — End: 2017-11-03

## 2017-11-03 MED ORDER — LACTATED RINGERS IV SOLN *I*
50.0000 mL/h | INTRAVENOUS | Status: AC
Start: 2017-11-03 — End: 2017-11-04
  Administered 2017-11-03: 50 mL/h via INTRAVENOUS
  Administered 2017-11-03 (×4): 50 mL/h

## 2017-11-03 MED ORDER — OXYTOCIN 30 UNITS IN 500ML NS WRAPPED *I*
INTRAMUSCULAR | Status: DC | PRN
Start: 2017-11-03 — End: 2017-11-03
  Administered 2017-11-03 (×2): 500 mL via INTRAVENOUS

## 2017-11-03 SURGICAL SUPPLY — 2 items
DRESSING ABDOMINAL PAD 5 X 9 LF (Dressing) ×3 IMPLANT
DRESSING TEGADERM 4 X 10IN (Dressing) ×6 IMPLANT

## 2017-11-03 NOTE — Progress Notes (Signed)
Patient was transported to [id number] room 21 in stable condition via stretcher. Report was given to Odette Horns, RN.

## 2017-11-03 NOTE — Discharge Instructions (Signed)
POSTPARTUM CESAREAN DELIVERY INSTRUCTIONS    General Activity:  On the day of discharge, go home and rest. Increase activity such as walking and stair climbing gradually. No strenuous activity or heavy lifting should be done for several weeks. Do not drive for 1-2 weeks after your delivery. It is easy to have an accident when you are tired or not moving as quickly due to pain.  You also should not drive for as long as you are taking narcotic pain medication.  Travel is fine after 2 weeks, but if on a long trip, get out and walk every 2 hours to maintain circulation. Often your feet and ankles will become swollen during the week after delivery. If you had IV fluids, you may swell for several weeks.      We recommend pelvic rest (nothing in the vagina) for at least 2 weeks, until your bleeding stops, or as otherwise instructed by your physician. This means no sexual intercourse, tampons, douching or deep baths.  Sitz bathes are permitted (a few inches of warm water only in the tub).     Emotional Changes: It is normal to experience some mild sadness or emotional swings in the first week or two after delivery. If your symptoms do not get better after 2 weeks or you are experiencing worsening sadness, mood swings, thoughts of hurting yourself or others, including your baby, please call us right away. Postpartum depression is real and treatable, but it is easier to treat the earlier it is detected.    Employment:  You may return to work 6-8 weeks after delivery. This is standard [place].      Incisional Care:  Keep your incision clean and dry.  You should let the warm soapy water fall over your incision in the shower.  You should not scrub the area.  If your incision is under your abdomen, gently lift your abdomen to allow the water into the area.  Pat dry with a towel when you get out of the shower or dry with a hairdryer on COOL only.  If you have Steri-strips (tape bandages) over your incision, they should be  removed after one week if they haven't fallen off on their own.  If they become dirty or wet, they can be removed earlier.  Neosporin and other products/creams should not be applied to the incision until directed by your physician.    If you notice redness, swelling, increased pain, or drainage from the incision, call your provider immediately.  It can sometimes be difficult to see your incision postpartum.  You may need a friend/family member or a mirror to see your incision.  Look at your incision daily at least for the first 2 weeks following your delivery.      Hemorrhoids:  Keep the perineal area clean with baths, showers or Sitz baths. Anesthetic numbing spray or similar ointment is available over the counter and can be applied as needed to stitches. If you have lacerations, they will heal within the next few weeks and the stitches will dissolve on their own. Preparation H, Anusol and Tucks are fine for hemorrhoids.     Breast Care:    If you are breastfeeding, remember that it is a new learning experience for you and your baby. Breastfeeding can come very easily for some new moms and for others it’s ok if it needs patience and perseverance. Our lactation consultants have already given you lots of information, and here are a few specific tips to help   you take care of your breasts:   - Wear a comfortable, supportive bra without underwires   - Take good care of yourself so you have lots of time and energy to bond with and feed   your baby. Try to rest as much as possible in between feedings.    - Make sure you have a comfortable, deep latch every time you feed your baby   - If your breasts become engorged, you can gently massage them, take a warm shower   or bath, use warm or cold packs for comfort, or even take some ibuprofen.    - For sore or dry nipples, try to use a little breast milk around your nipple and areola   (pigmented area around breasts) after feeding. Silicone nipple soothing pads or   cool packs  can also help. If your nipples are very sore, contact your healthcare   provider as we may need to help you find a more comfortable latch.    - If you feel stringy full areas in your breasts, you may have some clogged ducts (feel like   stringy clumps). These can be helped by massage and a warm shower or wash   cloth on the breast before you nurse.    For women who are breastfeeding or pumping breastmilk, [facility] is here for help and support when you need it most!    Check out our website: [email or web address]    To speak with a lactation consultant by phone, please call [phone] (WARM LINE) and someone will return your call within 24 hours.    Call [facility] at [phone] if you need to make an appointment with a provider or lactation consultant specializing in breastfeeding.    Patients are seen in the Breastfeeding Medicine offices at [address]. For patients using [facility] ([facility]) for their pediatrician, services are also available in the Ambulatory building at [place] (6th floor).       For parents who are breastfeeding or pumping and want to meet up with other parents for support and help, come to the [facility]!   FREE drop-in support   No appointment needed!    Share and connect with other breastfeeding women   Get support from lactation specialists:  • [facility] @[facility] - [address]--  on the 1st and 3rd Wednesday of every month from 1-3 pm.   • [facility] @[facility]--[address]--  on 2nd Tuesday of every month from 10:30am -12 noon  • [facility]  [address], [place]--Wednesdays from 10-11:30 am  More info and locations at [email or web address]       Breast infections can occur whether you are nursing or not. Symptoms include fever, chills, and painful areas on your breast. Usually  antibiotics are needed, so call us during the day with your pharmacy number. Gentle massage can help as well. If you are nursing, keep nursing while you contact your provider. If you need to use pain medications other than what we list in these instructions, check with your OB provider or pediatrician's office to determine if they are safe for breastfeeding.    If you are feeding formula only, a snug bra can help to prevent pain and engorgement. If you do get pain or swelling, you can apply ice packs to your breasts and take Ibuprofen. You may need to do this   for several days. We do not use medication of any kind to "dry up" your breast milk.    Abdominal Cramps:  "After birth pains" can be severe, especially with nursing and after the birth of the second or more child. They rarely last more than 4 days.    Pain Medication: Ibuprofen or acetaminophen are both safe to take while nursing and are usually effective at managing cramps.  Narcotic pain medication (Oxycodone, Percocet, Dilaudid) may be needed for pain not relieved with over-the-counter medications.  If you are taking a narcotic with Acetaminophen in it, do not take any additional Tylenol.  Narcotic pain medication can cause lightheadedness/dizziness, nausea/vomiting, confusion, sleepiness, and constipation.  Avoid taking these medications when home alone with the infant until you know how you will respond to them.  Do not drive when taking these medications.  You should take stool softners (Colace, Miralax) while taking these medications.    Vitamins and Iron:  Continue the vitamins throughout nursing or for 1 month if not nursing. Continue iron for 1 month.    Bathing:  Showers and shallow baths are fine. No douching. Deep baths are also not recommended.    Constipation:  Stool softeners such as Colace, Metamucil, Senokot, Miralax, or generic are fine to use for mild constipation. Use milk of magnesia as necessary for persistent constipation.        Menstruation:  Bleeding is expected for several weeks. It may be red, blackish with clots. It changes to pink and then yellow-gray.  Frequently, the discharge becomes red again. If the bleeding gets heavy again, you need to rest more.     The return of a real menstrual period varies. For non-nursing mothers, it is usually between 4-10 weeks. For nursing mothers, it can take over a year.  Several months of periods are usually required before cycles become regular. Bleeding may increase after you go home, due to increased activities.     Sexual Intercourse:  It takes varying times for pain from vaginal tears/episiotomies, hemorrhoids and swelling to go away so that you are comfortable. We recommend not resuming intercourse for at least 2 weeks, until your bleeding stops, or as otherwise instructed by your physician.   Nursing causes vaginal dryness and lubrication is often required. Remember, the possibility of becoming pregnant exists, even while nursing. You ovulate 2 weeks before the return of the first period, so you may become pregnant before your first period.      Contraception:  We recommend at least 18 months between pregnancies for you and your baby's health. Even if you are breastfeeding, you can become pregnant if you are not using a reliable and consistent form of contraception. We do not recommend using estrogen-containing products for the first several weeks after delivery due to the elevated risk of blood clots or while breastfeeding. Progesterone-only products, however, are perfectly safe immediately postpartum and do not interfere with breastfeeding.     Postpartum Visit:  Call our office to schedule an appointment. This visit will be scheduled 6 weeks after you delivered.  Your physician may ask you to schedule an incision check in the office one week following delivery.  This will be specified on your discharge paperwork.      Call if the following occur:   1. Fever of 100.4 degrees F or severe  chills. Check your temperature before calling, if possible. You may feel hot but you cannot determine if you have a true fever without a thermometer.      2. Excessively heavy vaginal bleeding.   3. Redness, swelling, or drainage from your incision.   4. Extreme urinary frequency and burning with urination.   5. Swelling or tenderness in one area of the breast.   6. Marked depression or anxiety.    7. Severe pain not improved with pain medication.   8. Chest pain or shortness or breath.   9. Significant swelling in one leg more than the other.   10. Persistent headache or visual changes.   11. Any other questions or concerns.      Please refer to your [facility] packet for any other questions not addressed here.

## 2017-11-03 NOTE — Anesthesia Procedure Notes (Signed)
---------------------------------------------------------------------------------------------------------------------------------------    AIRWAY   GENERAL INFORMATION AND STAFF    Patient location during procedure: OR       Date of Procedure: 11/03/2017 5:02 PM  CONDITION PRIOR TO MANIPULATION     Current Airway/Neck Condition:  Normal        For more airway physical exam details, see Anesthesia PreOp Evaluation  AIRWAY METHOD     Patient Position:  Sniffing    Preoxygenated: yes      Induction: IV    Number of Attempts at Approach:  1    Number of Other Approaches Attempted:  0  FINAL AIRWAY DETAILS    Final Airway Type:  Nasal cannula    Head position required to avoid obstruction:  Neutral    Insertion Site:  Left naris and right naris  ----------------------------------------------------------------------------------------------------------------------------------------

## 2017-11-03 NOTE — Anesthesia Procedure Notes (Addendum)
---------------------------------------------------------------------------------------------------------------------------------------    NEURAXIAL BLOCK PLACEMENT  Single Shot Spinal    Date of Procedure: 11/03/2017 5:00 PM    Patient Location:  OR    Reason for Block: intraoperative anesthetic    CONSENT AND TIMEOUT     Consent:  Obtained per policy    Timeout: patient identified (name/DOB) , operative procedure/site/side verified by patient or family , operative procedure/side/site verified against surgical consent and operative procedure/side/site verified against surgical schedule  METHOD:    Patient Position: sitting    Monitoring: blood pressure and continuous pulse oximetry    Sedation Used: no            For medications used, please see MAR    Level of Sedation: none      Prep: aseptic technique per protocol, povidone-iodine and patient draped      Successful Approach: midline    Successful Location: L3-4    Attempts (Skin Punctures):  2    Attempted Approach:  midline    Attempted Location: L3-4  SPINAL NEEDLE:      Introducer: 20 G    Type: Pencan    Gauge: 25 G    Length: 3.5 in    CSF Appearance: clear  OBSERVATIONS:    Block Completion:  Successfully completed    Sensory Levels: bilateral    Epidural sensory level: T6.    Wet Tap: Yes      Paresthesia: none  Neuraxial Blood: blood not aspirated      Motor Block: dense    Patient Reaction to Block: tolerated procedure well, vitals remained stable and fetal stability  STAFF     Performed by: extender under direct supervision    Attending Attestation: I was present for the entire procedure     Attending: Landry Mellow, MD  Extender: Teresa Pelton, MD  ----------------------------------------------------------------------------------------------------------------------------------------

## 2017-11-03 NOTE — Assessment & Plan Note (Signed)
No evidence of pre-pregnancy hypertension.   Patient denies prior hypertension or hypertensive disorders of pregnancy  Newly elevated BP 140/70 and repeated 143/98 today in clinic. She denies s/sx of preeclampsia.   HELLP labs and spot ordered.

## 2017-11-03 NOTE — Progress Notes (Signed)
SPA Prenatal Visit    SUBJECTIVE  Courtney Lambert is a 37 y.o., [id number] female presenting for routine OBC at [redacted]w[redacted]d. She was seen on Friday 10/11 with non reactive NST and non reassuring BPP. She was then sent for further evaluation at [facility]. She was found to have a reactive NST and IOL was recommended. During her IOL, she was found to be breech and therefore cervical foley was removed and IOL was stopped. FHT was monitored and repeat BPP was 8/8 and therefore, she was discharged home Saturday. Today, she returns for follow up evaluation and plan.      She reports good fetal movement since discharge. She denies leakage of fluid or vaginal bleeding. No contractions.     Denies any history of elevated blood pressures reported, no h/o gHTN or PreEclampsia.   Denies HA, blurry vision, RUQ pain or swelling.      Contractions: No   Loss of fluid: No   Bleeding: No   FM:  Yes      Pregnancy complicated by:   Patient Active Problem List   Diagnosis Code    Antepartum multigravida of advanced maternal age O47.529    BMI 39.0-39.9,adult Z68.39    History of cesarean delivery x3 Z98.891    Asthma affecting pregnancy, antepartum O99.519, J45.909    Pregnancy Z34.90    Elevated blood pressure affecting pregnancy in third trimester, antepartum O16.3     Problem list, vitals, family & social history, medications, and allergies were reviewed and updated as appropriate today.      OBJECTIVE  Vitals:    11/03/17 0911   BP: 140/70   Pulse: 81   Weight: 105.1 kg (231 lb 12.8 oz)              Urine POCT:        Patient did not leave urine specimen  Urine dipstick shows positive for WBC's, positive for RBC's and positive for leukocytes.  Micro exam: not done.       Exam:            General appearance - alert, well appearing, and in no distress  Mental status - alert, oriented to person, place, and time  Chest - breathing comfortably  Abdomen - soft, nontender, nondistended, gravid  Extremities - peripheral pulses normal, no pedal  edema    ASSESSMENT / PLAN  37 y.o., [id number] at [redacted]w[redacted]d with high-risk pregnancy complicated by prior cesarean delivery x3.     Orders Placed This Encounter   Procedures    Comprehensive metabolic panel     Standing Status:   Future     Standing Expiration Date:   12/03/2018    CBC     Standing Status:   Future     Standing Expiration Date:   12/03/2018    Protein, urine     Standing Status:   Future     Standing Expiration Date:   11/04/2018     Order Specific Question:   Collection type     Answer:   RANDOM    Urinalysis with reflex to microscopic     Standing Status:   Future     Standing Expiration Date:   12/03/2018     Today's Plan:  History of cesarean delivery x3  Patient with three prior cesarean deliveries as well as successful VBAC. She strongly desires VBAC and has been previously counseled on the risks, benefits and alternative options for VBAC including option for repeat cesarean delivery. VBAC consents  signed today and risks reviewed.   Patient is currently breech presentation. We discussed the option for ECV vs cesarean delivery in the setting of breech. Given her prior deliveries, we did not offer her breech vaginal delivery. She was most recently noted to be footling breech. We reviewed risks and benefits of ECV including need for emergent delivery, PROM, abruption, cord prolapse, and failure. We discussed that if the baby was able to be turned to vertex, we would likely induce labor given the clinical picture and risk of reverting to breech. This discussion could be repeated depending on the outcome. Consents were signed.   Following her visit, she had an ultrasound which showed oligohydramnios and a BPP of 4/8. She was sent directly to triage for further evaluation and delivery.     Elevated blood pressure affecting pregnancy in third trimester, antepartum  No evidence of pre-pregnancy hypertension.   Patient denies prior hypertension or hypertensive disorders of pregnancy  Newly elevated BP  140/70 and repeated 143/98 today in clinic. She denies s/sx of preeclampsia.   HELLP labs and spot ordered.    All consents were signed with [facility] interpreter and scanned into eRecord    Follow-up:   Return to the office in as needed for postpartum evaluation.      Pamala Duffel, MD  11/03/2017  12:28 PM

## 2017-11-03 NOTE — Anesthesia Preprocedure Evaluation (Addendum)
Anesthesia Pre-operative History and Physical for Courtney Lambert    Highlighted Issues for this Procedure:  Courtney Lambert is a 37 yo [id number] at 37w who presents for a external cephalic version    Allergies: None    PMHx: Asthma: mild persistent  Obesity    Prior OB Hx: She has had 3 prior LTCS, thad 2 VBAC deliveries followed by 2 additional LTCS all performed in [place], [place]    Labs:   Hct 35  Plts 244  Also, type and screen is current (from 10/31/17)    U/S 10/14:  Presentation:    Breech  Placenta:      Anterior Left Lateral    NPO for solids at 0800 on 11/03/17.             .  .  Anesthesia Evaluation Information Source: records, family, patient, via interpreter     ANESTHESIA HISTORY     Denies anesthesia history  Pertinent(-):  No History of anesthetic complications or Family hx of anesthetic complications    GENERAL    + Obesity    HEENT     Denies HEENT issues PULMONARY    + Asthma          mild persistent  Pertinent(-):  No smoking or COPD    CARDIOVASCULAR     Denies cardiovascular issues  Good(4+METs) Exercise Tolerance  Pertinent(-):  No hypertension    GI/HEPATIC/RENAL     Denies GI/hepatic/renal issues  Last PO Intake: >8hr before procedure NEURO/PSYCH     Denies neuro/psych issues    ENDO/OTHER     Denies endo issues  Pertinent(-):  No diabetes mellitus, thyroid disease    HEMATOLOGIC     Denies hematologic issues  Pertinent(-):  No coagulopathy or anticoagulants/antiplatelet medications         Physical Exam    Airway            Mouth opening: normal            Mallampati: II            TM distance (cm): 3            Neck ROM: full            Airway Impression: easy  Dental   Normal Exam   Cardiovascular  Normal Exam           Rhythm: regular           Rate: normal        General Survey    Normal Exam   Pulmonary     No rhonchi, wheezes   Comment: Scattered insp rhonchi and expiratory wheezes, but easy and effective air movement -Dr. Evonnie Dawes    Mental Status   Normal Exam        ________________________________________________________________________  PLAN  ASA Score  2  Anesthetic Plan spinal    ; Airway (nasal cannula); Line ( use current access); Monitoring (standard ASA); Positioning (left uterine tilt, supine and arms out); PONV Plan (ondansetron); Pain (per surgical team); PostOp (PACU)    Informed Consent     Risks:         Risks discussed were commensurate with the plan listed above with the following specific points: N/V, aspiration, hypotension, headache, failed block, infection, dizziness, unsteadiness and fatigue, Damage to: nerves and blood vessels, allergic Rx, unexpected serious injury and death.    Anesthetic Consent:         Anesthetic plan (and risks as noted  above) were discussed with patient, spouse and adult children    Plan also discussed with team members including:       resident and attending    Attending Attestation:  As the primary attending anesthesiologist, I attest that the patient or proxy understands and accepts the risks and benefits of the anesthesia plan. I also attest that I have personally performed a pre-anesthetic examination and evaluation, and prescribed the anesthetic plan for this particular location within 48 hours prior to the anesthetic as documented. Malena Catholic, MD,PHD 3:21 PM    Note: This attending preanesthetic evaluation was completed at 1459 on 11/03/17, in room 03-1418. As the patient wished, we used a combination of interpretation by [employer] and her bedside relatives. -Dr. Evonnie Dawes

## 2017-11-03 NOTE — Anesthesia Case Conclusion (Signed)
CASE CONCLUSION  Emergence  Criteria Used for Airway Removal:  Adequate Tv & RR and acceptable O2 saturation  Assessment:  Routine  Transport  Directly to: PACU  Position:  Recumbent  Patient Condition on Handoff  Level of Consciousness:  Alert/talking/calm  Patient Condition:  Stable  Handoff Report to:  RN

## 2017-11-03 NOTE — Anesthesia Postprocedure Evaluation (Signed)
Anesthesia Post-Op Note    Patient: Courtney Lambert    Procedure(s) Performed:  Procedure Summary  Date:  11/03/2017 Anesthesia Start: 11/03/2017  4:26 PM Anesthesia Stop: 11/03/2017  6:54 PM Room / Location:  S_OR_OB-A / [facility] L&D   Procedure(s):  C-SECTION  EXTERNAL CEPHALIC VERSION Diagnosis:  * No pre-op diagnosis entered * Surgeon(s):  Thornburg, Gilman Buttner, MD  Mosetta Pigeon, MD  Webb Silversmith, MD Attending Anesthesiologist:  Landry Mellow, MD         Recovery Vitals  BP: 120/84 (11/03/2017  7:15 PM)  Heart Rate: 64 (11/03/2017  7:15 PM)  Resp: 17 (11/03/2017  7:15 PM)  Temp: 36.4 C (97.5 F) (11/03/2017  6:53 PM)  SpO2: 100 % (11/03/2017  7:15 PM)   0-10 Scale: 7 (11/03/2017  7:19 PM)    Anesthesia type:  Spinal  Complications Noted During Procedure or in PACU:  None   Comment:    Patient Location:  Labor and Delivery  Level of Consciousness:    Awake and alert  Patient Participation:     Able to participate  Airway Patency:     Yes  Pulmonary Status:    Stable  Neuraxial Block Evaluation:    Block resolving  Pain Management:    Adequate analgesia  Nausea and Vomiting:  None    Post Op Assessment:    Tolerated procedure well"   Attending Attestation:  All indicated post anesthesia care provided       -

## 2017-11-03 NOTE — Op Note (Addendum)
Operative Note (Surgical Case/Log ID: [id number])       Date of Surgery: 11/03/2017       Surgeons: Surgeon(s) and Role:     * Thornburg, Gilman Buttner, MD - Primary     * Mosetta Pigeon, MD - Resident - Assisting     Rolanda Lundborg, Baxter Hire, MD       Pre-op Diagnosis: * No Diagnosis Codes entered *       Post-op Diagnosis: * No Diagnosis Codes entered *       Procedure(s) Performed: Procedure(s) (LRB):  C-SECTION (N/A)  EXTERNAL CEPHALIC VERSION (N/A)       Anesthesia Type: Spinal        Fluid Totals: I/O this shift:  10/14 1500 - 10/14 2259  In: 3422.7 [I.V.:3422.7]  Out: 1050 [Urine:450; Blood:600]  Net: 2372.7       Estimated Blood Loss: 600 mL       Specimens to Pathology:  None       Temporary Implants:        Packing:                 Patient Condition: good       Indications: 37 year old G12 11009, now [id number] who presented at [redacted]w[redacted]d for delivery for [facility] 4/8. Of note, she was recently admitted to [facility] from 10/11-10/12 for equivocal NST BPP 4/8. Induction was recommended, and she had a cervical foley placed and was subsequently found to have fetal breech presentation. She had a reassuring tracing overnight and repeat BPP 8/8, and she was discharged with plan for outpatient delivery planning.     She was seen for her prenatal visit on the day of presentation, and had a repeat BPP 4/8, and was again referred to [facility] for delivery. She was also found to have SROM upon arrival. Pregnancy risks include single elevated BP and hx of LTCS x 3 (with a successful VBAC). Upon arrival, she had extensive counseling about risks of TOLAC vs rLTCS. She strongly desired possible vaginal delivery and opted for attempted [facility] with spinal anesthesia, with plan to proceed to delivery if unsuccessful.        Findings (Including unexpected complications): ECV: Infant in breech position with head to maternal right and belly in lower uterine segment with feet to maternal right.     Normal appearing external genitalia.   Thickened fascia  layer, densely adhered to underlying rectus muscle, without clear distinction between the fascia and rectus layers.   Several small subserosal fibroids palpated on the uterus. Uterine window visualized in the lower uterine segment, as well as a small subserosal hematoma in the lower uterine segment.   Left sided extension at the hysterotomy.   Delivery of a viable female infant from the breech position, weighing [phone] with APGARs 6 and 9.        Description of Procedure:     Obstetrics Operative Note    Date of Surgery: 11/04/2017    Attending: Webb Silversmith, MD  Lead Resident: Keene Breath, MD     Pre-Op Diagnosis:   1. Singleton pregnancy at [redacted]w[redacted]d  2. Breech position   3. Failed ECV  4. Hx of LTCS x 3  5. Single MR BP  6. Asthma  7. Hx of PPH  8. Obesity    Anesthesia Type:  Spinal     Medications received in the OR:  Prior to skin incision: Ancef and azithromycin  Postpartum: Pitocin    Post-Op Diagnosis:  1. Singleton pregnancy at [redacted]w[redacted]d  2. Breech position   3. Failed ECV  4. Hx of LTCS x 3  5. Single MR BP  6. Asthma  7. Hx of PPH  8. Obesity  9. S/p repeat LTCS    Operative Procedure:   1. Failed ECV  2. Repeat low transverse cesarean delivery    Estimated Blood Loss: 600cc  Drains: Foley catheter, plan for removal on POD#1  Fluid Totals: Please see anesthesia documentation.    Specimens to Pathology: None    Patient Condition: Stable     Indications for Procedure:   37 year old G12 11009, now [id number] who presented at [redacted]w[redacted]d for delivery for [facility] 4/8. Of note, she was recently admitted to [facility] from 10/11-10/12 for equivocal NST BPP 4/8. Induction was recommended, and she had a cervical foley placed and was subsequently found to have fetal breech presentation. She had a reassuring tracing overnight and repeat BPP 8/8, and she was discharged with plan for outpatient delivery planning.     She was seen for her prenatal visit on the day of presentation, and had a repeat BPP 4/8, and was again referred to [facility]  for delivery. She was also found to have SROM upon arrival. Pregnancy risks include single elevated BP and hx of LTCS x 3 (with a successful VBAC). Upon arrival, she had extensive counseling about risks of TOLAC vs rLTCS. She strongly desired possible vaginal delivery and opted for attempted [facility] with spinal anesthesia, with plan to proceed to delivery if unsuccessful.     Operative Findings:  ECV: Infant in breech position with head to maternal right and belly in lower uterine segment with feet to maternal right.     Normal appearing external genitalia.   Thickened fascia layer, densely adhered to underlying rectus muscle, without clear distinction between the fascia and rectus layers.   Several small subserosal fibroids palpated on the uterus. Uterine window visualized in the lower uterine segment, as well as a small subserosal hematoma in the lower uterine segment.   Delivery of a viable female infant from the breech position, weighing [phone] with APGARs 6 and 9.     The patient was taken to the operating room and placed on the operating room table. A stop check was performed confirming the patient and the procedure to be completed.  Spinal anesthesia was administered without difficulty and the patient was placed in the left lateral tilt position.  Fetal heart tones were noted to be in the 150s.  A Foley catheter was placed under sterile conditions.    Bedside ultrasound confirmed breech position with fetal head to maternal right. An attempt was made to rotate fetus in the clockwise.  Ultrasound was performed which showed that the first attempt was unsuccessful and that the fetal heart rate was low at 90 bpm, which immediately resolved to 150. A second attempt was made to rotate the fetus in the counter clockwise direction. Ultrasound revealed that second attempt was unsuccessful and that the fetal heart was normal in the 150s. A third attempt was made to rotate the fetus in the clockwise direction. Ultrasound  revealed that third attempt was unsuccessful and that the fetal heart was normal in the 150s     The patient's perineum was prepped with chlorhexidine, and her abdomen was prepped with Chlora-Prep.  This was allowed to dry for three full minutes, and a sterile drape was placed.  The surgical pause was completed. Pfannenstiel skin incision was made and  carried down sharply to the fascia. The fascia was noted to be thick and densely adhered to the underlying rectus muscle, without clear planes visualized. It was carefully dissected layer by layer until entry into the peritoneal cavity, which was opened transversely using sharp and blunt dissection. No intra-peritoneal or uterine incisions were noted. A small partial Maylard incision was made on the right side to ensure adequate space for delivery. The incision was extended superiorly and inferiorly with good visualization of the bladder and a bladder blade was placed.     A low transverse uterine incision was made and extended transversely by cephalo-caudad stretching.  The amniotic fluid was noted to be clear at the time of rupture. There was delivery of a viable female infant in the breech position using the usual breech maneuvers. The infant's mouth and nares were bulb suctioned on the abdomen. The infant's cord was clamped and cut, and the infant was passed to the awaiting personnel. The placenta was then delivered manually.     The uterus was then exteriorized and cleared of all clots and debris. A left sided extension was noted at the hysterotomy. The uterine incision was re-approximated with interlocking suture of 0 Vicryl followed by a second imbricating layer of 0 Vicryl.       The uterus was then placed back into the abdominal cavity and was inspected with good hemostasis noted.  A wound sweep was performed noting no retained instruments or sponges. Attention was then turned to the fascia, which was re-approximated with a running suture of 0 Vicryl.  Bovie  cautery was used for hemostasis of the subcutaneous layer. The subcutaneous layer was re-approximated with running Plain gut suture. The skin was re-approximated with quill.  A sterile dressing was applied.     The patient tolerated the procedure well, and was taken to the Post Anesthesia Care Unit in stable and satisfactory condition.   All sponge, needle and instrument counts were correct at the end of the procedure. The foley was draining clear urine at the end of the procedure.     Signed:  Mosetta Pigeon, MD  on 11/03/2017 at 10:14 PM

## 2017-11-03 NOTE — Progress Notes (Addendum)
Note for external version attempt procedure. With Dr Rolanda Lundborg.    Start: FHR 153 (post anesthesia)    1659- attempt from L side  1700- attempt from R side  1703- FHR 75 (break to allow baby to recover)  1704- FHR 95  1705- FHR 135 (break to allow baby to recover)    1708- attempt from L side while attempting to lift from cervix  1709- FHR 130    1711- attempt from L side  1713- FHR 110  1714- FHR 130  1715- FHR 135  1716- FHR 140

## 2017-11-03 NOTE — Progress Notes (Signed)
In to see patient for free flow with fundal check.     Approximately 50cc of free flow with fundal check.   Umbilicus firm at U.   Previously had approximately 50 cc of free flow with RN.     Total EBL in C/S: 600  Total EBL in PACU: 100    Total EBL 700.     Will give buccal miso now.     Keene Breath, MD  Ob/Gyn R3  [phone] 7:38 PM 11/03/2017

## 2017-11-03 NOTE — H&P (Signed)
OBSTETRICS ADMISSION HISTORY & PHYSICAL        Primary OB-GYN: [facility]    Reason for Admission (Chief Complaint): Sent in from office    HPI     Courtney Lambert is a 37 y.o., [id number] female presenting at 49w6dwith BPP4/8.     She was seen on Friday 10/11 with non reactive NST and non reassuring BPP. [facility] She was found to have a reactive NST and IOL was recommended. During her IOL, she was found to be breech and therefore cervical foley was removed and IOL was stopped. FHT was monitored and repeat BPP was 8/8 and therefore, she was discharged home Saturday.  She had an office visit today and had another 4/8 BPP.  Still footling breech.     She reports good fetal movement since discharge.     She reports LOF since this morning. Spec confirms ROM.  She desires a vaginal delivery, and thus declines cesarean without an attempt at external cephalic version first.  If ECV unsuccessful, then she is amenable to cesarean.        Pregnancy Risks     History of 3 prior cesarean sections   - In 2009, 2014, 2015    History of Prior Stillbirth - 2009    Asthma  - Ventolin      Obesity      Past Medical History     Past Medical History:   Diagnosis Date    Asthma     Obesity (BMI 35.0-39.9 without comorbidity)        Past Surgical History     Past Surgical History:   Procedure Laterality Date    CESAREAN SECTION, UNSPECIFIED  2014    CESAREAN SECTION, UNSPECIFIED  2015    CESAREAN SECTION, UNSPECIFIED  2009    HAND SURGERY         Obstetrical History     OB History   Gravida Para Term Preterm AB Living   12 11 11  0 0 9   SAB TAB Ectopic Multiple Live Births           10      # Outcome Date GA Lbr Len/2nd Weight Sex Delivery Anes PTL Lv   12 Current            11 Term 242    CS-Unspec         [facility] Comments: says [facility] wasn't moving, [facility] [place]     C-Sec, Unspe         B[facility] Comments: says [facility] wasn't moving[place] [facility] [place]  9 Term 2011     VBAC   LIV   8 Term 2010     VBAC   LIV   7 Term 2009     CS-Unspec   FD   6 Term 2007     Vag-Spont   LIV   5 Term 2006     Vag-Spont   LIV   4 Term 2004     Vag-Spont   LIV   3 Term 2002     Vag-Spont   LIV   2 Term 137    Vag-Spont      1 Term 1995    M Vag-Spont          Allergies   No Known Allergies (drug, envir, food or latex)  Current Home Medications     Prior to Admission medications    Medication Sig Start Date End Date Taking? Authorizing Provider   VENTOLIN HFA 108 (90 BASE) MCG/ACT inhaler  11/03/14   [provider]       GYN History, Social History, and Family History reviewed and updated in eRecord.      Review of Systems     Constitutional: Negative for fever or fatigue.  Psych: Denies depressive symptoms or anxiety.  Cardiovascular: Denies chest pain or palpitations.  Respiratory: Denies shortness of breath or orthopnea.  Gastrointestinal: Denies nausea/vomiting.    Musculoskeletal: Denies muscle weakness.  Skin/Extremities: Denies peripheral edema.          Prenatal Labs     Recent Labs   Lab 11/03/17  1436 10/31/17  1705   WBC 7.8 6.2   Hemoglobin 11.6 12.0   Hematocrit 35 36   Platelets 224 217     No results for input(s): NA, K, CL, CO2, UN, CREAT, GFRC, GLU in the last 168 hours. No results for input(s): LD, URIC, ALT, AST, ALK, TB in the last 168 hours.   No results for input(s): UTPR, UCRR in the last 168 hours.    Urine spot P/C ratio:  No results for input(s): TPCREATRATIO in the last 8760 hours.          Lab results: 11/03/17  1436 10/31/17  2129 10/31/17  1705 07/25/17  1530   ABO RH Blood Type O RH POS  --  O RH POS O RH POS   Rubella IgG AB  --   --   --  POSITIVE   Group B Strep Culture  --  .  --   --    Syphilis Screen  --   --  Neg Neg   HIV 1&2 ANTIGEN/ANTIBODY  --   --   --  Nonreactive   HBV S Ag  --   --   --  NEG        Lab results: 09/19/17  1703   Glucose,50gm 1HR 77      No results for input(s): GL0, GL1H, GL2H, GL3H  in the last 8760 hours.              Physical Exam     Vitals:    11/03/17 1325 11/03/17 1430   BP: 130/62    Pulse: 74 75   Resp: 18    Temp: 36 C (96.8 F)    TempSrc: Temporal    SpO2: 99%        General Appearance: No distress  Mental Status: Alert and oriented x 3  HEENT: Normocephalic, atraumatic.  Cardiovascular: normal rate, regular rhythm, normal S1, S2, no murmurs, rubs, clicks or gallops  Respiratory: clear to auscultation, no wheezes, rales or rhonchi, symmetric air entry  Abdomen: Soft, gravid, non-tender.      Uterus: Gravid.  Non-tender to palpation.  Neurological: Normal      External Genitalia: Unremarkable external genitalia without lesions.  Normal appearing labia majora/minora and introitus.  Vagina: Pink, ruggated vaginal mucosa without lesions.      Pooling of clear Amniotic fluid  +Ferning    Cervical Exam: 1 cm / long     Presentation: transverse to breech head maternal right, spine up, feet down    Placental location: anterior      Fetal Monitoring:  Baseline: 140 bpm  Variability: Moderate (6-25 BPM)  Accelerations: No  Decelerations: None  Category: I  Toco: rare contractions      Assessment & Plan     Courtney Lambert is a 37 y.o. [id number] at 40w6dwith pregnancy complicated by risks outlined above [facility] and fetal malpresentation and prelabor rupture of membranes.    Fetal malpresentation  - Patient was counseled on the risks and benefits of external cephalic version including placental abruption, uterine rupture, fetal bradycardia, and cord prolapse requiring emergency cesarean  - Patient motivated to attempt ECV under spinal in OR. If unsuccessful, amenable to cesarean under spinal.  - If successful, she is motivated to have VBAC.    - Counseled her on the risks of a trial of labor after cesarean, including rupture of uterus (occurs in about 1 in 100 to 1 in 200 attempts).  Counseled that use of oxytocin may increase this risk.   - Counseled that risk to baby if the uterus  ruptures include brain damage or death.  Counseled that risks to the mother if the uterus ruptures the need for emergency surgery, which may include removal of the uterus (hysterectomy) if repair is impossible, and risks of blood loss, transfusion, infection and even death.  - Counseled that if TOLAC is unsuccessful, a repeat cesarean delivery would be necessary.  Counseled that the risks are higher when a repeat cesarean section is performed as an emergency because of the scar tissue that may have formed after a prior cesarean delivery.    - Counseled on the risks of a cesarean delivery, including, bleeding that may require a blood transfusion, infection, damage to bowel, bladder, uterus, or other internal organs, damage to uterus which could require a hysterectomy, complications with anesthesia, blood clots in the legs, pelvis, or lungs.;    Maternal and fetal risks and benefits of delivery between 36 0/7 and 38 6/7 weeks were discussed with the mother. Patient understands the risks and benefits of delivery at < [redacted] weeks gestation and that the indication(s) of  BPP 4/8 makes delivery at this time appropriate.     Postpartum planning                 - Rh positive / Rubella immune /HIV negative / GBS: pending    D/w Dr. THerma Carson MD  OB/GYN Resident PGY-4  11/03/17 3:42 PM  Pager [phone]

## 2017-11-03 NOTE — Progress Notes (Signed)
Pt arrived to 312-21 in stable condition. Pt oriented to unit and call bell. Vitals stable and pain medication administered. See MAR. Will continue to monitor.

## 2017-11-03 NOTE — Assessment & Plan Note (Addendum)
Patient with three prior cesarean deliveries as well as successful VBAC. She strongly desires VBAC and has been previously counseled on the risks, benefits and alternative options for VBAC including option for repeat cesarean delivery. VBAC consents signed today and risks reviewed.   Patient is currently breech presentation. We discussed the option for ECV vs cesarean delivery in the setting of breech. Given her prior deliveries, we did not offer her breech vaginal delivery. She was most recently noted to be footling breech. We reviewed risks and benefits of ECV including need for emergent delivery, PROM, abruption, cord prolapse, and failure. We discussed that if the baby was able to be turned to vertex, we would likely induce labor given the clinical picture and risk of reverting to breech. This discussion could be repeated depending on the outcome. Consents were signed.   Following her visit, she had an ultrasound which showed oligohydramnios and a BPP of 4/8. She was sent directly to triage for further evaluation and delivery.

## 2017-11-04 LAB — CBC
Hematocrit: 31 % — ABNORMAL LOW (ref 34–45)
Hemoglobin: 10.2 g/dL — ABNORMAL LOW (ref 11.2–15.7)
MCH: 29 pg/cell (ref 26–32)
MCHC: 33 g/dL (ref 32–36)
MCV: 87 fL (ref 79–95)
Platelets: 217 10*3/uL (ref 160–370)
RBC: 3.6 MIL/uL — ABNORMAL LOW (ref 3.9–5.2)
RDW: 12.7 % (ref 11.7–14.4)
WBC: 10.3 10*3/uL — ABNORMAL HIGH (ref 4.0–10.0)

## 2017-11-04 MED ORDER — OXYCODONE HCL 10 MG PO TABS *I*
10.0000 mg | ORAL_TABLET | Freq: Once | ORAL | Status: AC
Start: 2017-11-04 — End: 2017-11-04
  Administered 2017-11-04: 10 mg via ORAL

## 2017-11-04 MED ORDER — LACTATED RINGERS IV BOLUS *I*
1000.0000 mL | Freq: Once | INTRAVENOUS | Status: AC
Start: 2017-11-04 — End: 2017-11-04
  Administered 2017-11-04: 1000 mL via INTRAVENOUS

## 2017-11-04 NOTE — Lactation Note (Signed)
This note was copied from a baby's chart.  Lactation Consultant Daily Visit   Patient: Courtney Lambert " "        Age: 39 days              Corrected GA: 38w 0d  MRN: [id number]      Maternal Information   Mothers Name:   Courtney Lambert    Visit Type: routine/daily         Interventions and Instructions   Assisted Mother with positioning daughter at breast. Mother using the cross cradle hold.  Encouraged Mother to massage breast/use hand expression prior to latch. Mother did not obtain any drops of colostrum.  Reinforced: nose to nipple positioning, allowing infant to open her mouth wide to allow enough breast tissue to be taken into mouth,holding tummy to tummy,not placing her hand on the back of infant's head and to support her breast. Infant latched with wide open mouth: suck/swallow noted. Mother felt latch was comfortable. Demonstrated howe to use breast compression while infant at breast. Reviewed signs of a satisfied infant at end of feeding. Mother pleased with effort. Praised Mother and offered continued lactation support.        Donalda Ewings, RN  Lactation Consultant

## 2017-11-04 NOTE — Progress Notes (Addendum)
OBSTETRICS CESAREAN DELIVERY POST-OP PROGRESS NOTE   Postpartum Day: 1    Subjective     Courtney Lambert is doing well this morning. She is reporting diffuse abdominal pain overlying her entire abdomen, presumably from the failed ECV.  Pain is overall well-controlled with current regimen.  Tolerating regular diet without nausea/vomiting.  She  has ambulated.  Denies chest pain, SOB, or lightheadedness.  Appropriate vaginal bleeding.        Objective     Vitals:    11/04/17 0004 11/04/17 0030 11/04/17 0409 11/04/17 0500   BP: 135/82  131/68    BP Location:       Pulse: 99  76    Resp: 18 18 18 18    Temp: 36.3 C (97.3 F)  36.2 C (97.2 F)    TempSrc: Temporal  Temporal    SpO2: 99%  99%        Urine Output: Urine output: 200 cc over last 8 hours = 50cc/hour    Mental Status: Alert and oriented x 3  Cardiovascular: Regular rate and rhythm with no murmurs  Respiratory: Clear to auscultate  Abdomen: Soft, appropriately tender, nondistended, +BS  Incision: dressing in place  Fundus: Fundus firm at umbilicus  Extremities/Skin: Minimal edema of pedal and pretibial (1+)      Labs     Recent Labs   Lab 11/04/17  0419 11/03/17  1436 10/31/17  1705   Hematocrit 31* 35 36       ABO RH Blood Type (no units)   Date Value   11/03/2017 O RH POS                    Rubella IgG AB (no units)   Date Value   07/25/2017 POSITIVE           Assessment & Plan     Courtney Lambert is a 37 y.o. [id number] on POD# 1 s/p repeat C-Sec, Low Transverse  at [redacted]w[redacted]d for failed ECV and  BPP 4/8.  Doing well.    Routine postoperative care:  - Pain management: toradol -> motrin, tylenol, oxycodone PRN  - DVT Prophylaxis: SCDs. Early ambulation.  - UOP: 50cc/hour; Foley in place, will give 1L IVF bolus now, and keep foley in until later this morning to assess urine oupt  - FEN: Regular diet.    - PPHct as above. Ferrous sulfate is not indicated on discharge.    Post-op  - EBL 600  - pain: toradol -> motrin, tylenol, oxy PRN    Uterine atony  - 100cc in PACU  - total EBL  700  - s/p buccal miso  - bleeding stable overnight    gHTN  - normal to MR BPs overnight  - consider obtaining HELLP labs pending BP control today    Asthma  - ventolin    History of 3 prior cesarean sections   - In 2009, 2014, 2015     Hx PPH    History of Prior Stillbirth - 2009    Postpartum care:  - Rh status as above. Rhogam is not indicated.    - Infant: female    Disposition: Plan for D/C home POD # 3.    Keene Breath, MD   Ob/Gyn R3  [phone] 5:54 AM 11/04/2017

## 2017-11-04 NOTE — Plan of Care (Signed)
Problem: C-Section Postpartum Care  Goal: Vital signs are medically acceptable  11/04/2017 2257 by Jim Like, RN  Outcome: Maintaining  11/04/2017 2257 by Jim Like, RN  Outcome: Maintaining  Goal: Respiratory - Lungs clear to auscultation  11/04/2017 2257 by Jim Like, RN  Outcome: Maintaining  11/04/2017 2257 by Jim Like, RN  Outcome: Maintaining  Goal: Cardiovascular - Homan's sign negative  11/04/2017 2257 by Jim Like, RN  Outcome: Maintaining  11/04/2017 2257 by Jim Like, RN  Outcome: Maintaining  Goal: Patient will remain free of falls  11/04/2017 2257 by Jim Like, RN  Outcome: Maintaining  11/04/2017 2257 by Jim Like, RN  Outcome: Maintaining  Goal: Dressing intact until removed with any drainage marked  11/04/2017 2257 by Jim Like, RN  Outcome: Maintaining  11/04/2017 2257 by Jim Like, RN  Outcome: Maintaining  Goal: Fundus firm at midline  11/04/2017 2257 by Jim Like, RN  Outcome: Maintaining  11/04/2017 2257 by Jim Like, RN  Outcome: Maintaining  Goal: Moderate rubra freeflow, no purulent discharge, no foul smelling lochia  11/04/2017 2257 by Jim Like, RN  Outcome: Maintaining  11/04/2017 2257 by Jim Like, RN  Outcome: Maintaining  Goal: Urine output is 30 ml/hour or more  Description  Urinary Catheter is draining yellow urine 30 ML/hour or more.  11/04/2017 2257 by Jim Like, RN  Outcome: Maintaining  11/04/2017 2257 by Jim Like, RN  Outcome: Maintaining  Goal: Breasts are soft with nipple integrity intact  11/04/2017 2257 by Jim Like, RN  Outcome: Maintaining  11/04/2017 2257 by Jim Like, RN  Outcome: Maintaining  Goal: Demonstrates appropriate breast feeding techniques  11/04/2017 2257 by Jim Like, RN  Outcome: Maintaining  11/04/2017 2257 by Jim Like, RN  Outcome: Maintaining  Goal: Patient is able to void/empty bladder after catheter is  removed  11/04/2017 2257 by Jim Like, RN  Outcome: Maintaining  11/04/2017 2257 by Jim Like, RN  Outcome: Maintaining  Goal: Positive Mother-Baby interactions are observed  11/04/2017 2257 by Jim Like, RN  Outcome: Maintaining  11/04/2017 2257 by Jim Like, RN  Outcome: Maintaining  Goal: Ambulates independently  11/04/2017 2257 by Jim Like, RN  Outcome: Maintaining  11/04/2017 2257 by Jim Like, RN  Outcome: Maintaining     Problem: Pain  Goal: Patient's pain/discomfort is manageable  11/04/2017 2257 by Jim Like, RN  Outcome: Maintaining  11/04/2017 2257 by Jim Like, RN  Outcome: Maintaining  Goal: Report decrease in pain level  Description  Alleviation of pain or a reduction in pain to a level of comfort that is acceptable to the patient.  11/04/2017 2257 by Jim Like, RN  Outcome: Maintaining  11/04/2017 2257 by Jim Like, RN  Outcome: Maintaining     Problem: Knowledge Deficit  Goal: Patient/S.O. demonstrates understanding of disease process, treatment plan, medications, and discharge instructions.  11/04/2017 2257 by Jim Like, RN  Outcome: Maintaining  11/04/2017 2257 by Jim Like, RN  Outcome: Maintaining     Problem: Safety - OB  Goal: Follow unit's safety guidelines  11/04/2017 2257 by Jim Like, RN  Outcome: Maintaining  11/04/2017 2257 by Jim Like, RN  Outcome: Maintaining     Problem: Newborn Care  Goal: Vital signs are medically acceptable  11/04/2017 2257 by Jim Like, RN  Outcome: Maintaining  11/04/2017 2257 by Jim Like, RN  Outcome: Maintaining  Goal: Thermoregulation maintained per guidelines  11/04/2017 2257 by Jim Like, RN  Outcome: Maintaining  11/04/2017 2257 by Jim Like, RN  Outcome: Maintaining  Goal: Infant exhibits minimal/reduced signs of pain/discomfort  11/04/2017 2257 by Jim Like, RN  Outcome: Maintaining  11/04/2017 2257 by Jim Like,  RN  Outcome: Maintaining  Goal: Infant is maintained in safe environment  11/04/2017 2257 by Jim Like, RN  Outcome: Maintaining  11/04/2017 2257 by Jim Like, RN  Outcome: Maintaining  Goal: Baby is with Mother and family  11/04/2017 2257 by Jim Like, RN  Outcome: Maintaining  11/04/2017 2257 by Jim Like, RN  Outcome: Maintaining     Problem: Safety  Goal: Patient will remain free of falls  11/04/2017 2257 by Jim Like, RN  Outcome: Maintaining  11/04/2017 2257 by Jim Like, RN  Outcome: Maintaining     Problem: Pain/Comfort  Goal: Patient's pain or discomfort is manageable  11/04/2017 2257 by Jim Like, RN  Outcome: Maintaining  11/04/2017 2257 by Jim Like, RN  Outcome: Maintaining     Problem: Nutrition  Goal: Patient's nutritional status is maintained or improved  11/04/2017 2257 by Jim Like, RN  Outcome: Maintaining  11/04/2017 2257 by Jim Like, RN  Outcome: Maintaining     Problem: Mobility  Goal: Patient's functional status is maintained or improved  11/04/2017 2257 by Jim Like, RN  Outcome: Maintaining  11/04/2017 2257 by Jim Like, RN  Outcome: Maintaining     Problem: Psychosocial  Goal: Demonstrates ability to cope with illness  11/04/2017 2257 by Jim Like, RN  Outcome: Maintaining  11/04/2017 2257 by Jim Like, RN  Outcome: Maintaining     Problem: Cognitive function  Goal: Cognitive function will be maintained or return to baseline  11/04/2017 2257 by Jim Like, RN  Outcome: Maintaining  11/04/2017 2257 by Jim Like, RN  Outcome: Maintaining

## 2017-11-04 NOTE — Lactation Note (Signed)
This note was copied from a baby's chart.  Lactation Consultant Initial Visit   Patient: Courtney Lambert " "          Age: 37 years                MRN: [id number]     Maternal Information     Mothers Name:   Nylia Gavina    Visit Type: Admission Note/First face to face with mother   Support Person Present: yes    Delivery Date/Time: 11/03/2017 5:44 PM Delivery Type: C-Sec, Low Transverse     Contributing Maternal Medical History:    Past Medical History:   Diagnosis Date    Asthma     Obesity (BMI 35.0-39.9 without comorbidity)      Past Surgical History:   Procedure Laterality Date    CESAREAN SECTION, UNSPECIFIED  2014    CESAREAN SECTION, UNSPECIFIED  2015    CESAREAN SECTION, UNSPECIFIED  2009    HAND SURGERY       History reviewed. No pertinent family history.   Social History     Socioeconomic History    Marital status: Married     Spouse name: Not on file    Number of children: Not on file    Years of education: Not on file    Highest education level: Not on file   Tobacco Use    Smoking status: Never Smoker    Smokeless tobacco: Never Used   Substance and Sexual Activity    Alcohol use: No    Drug use: No    Sexual activity: Not on file   Other Topics Concern    Not on file   Social History Narrative    Not on file       Information for the patient's mother:  Zhanae, Proffit [[id number]]     OB History   Gravida Para Term Preterm AB Living   12 12 12  0 0 10   SAB TAB Ectopic Multiple Live Births   0 0 0 0 11        Delivery Comments: NICU called to unscheduled C/S of 37 6/7 weeks infant by [facility] attending. Infant handed to NICU team cyanotic, with weak cry, and HR <100 >60. Infant dried, stimulated, and bulb suctioned for a large amount of blood tingued mucus. Infant started crying with HR >100 after large amount of clear mucus suctioned from nares. Infant pinked up by 5 MOL and was crying, vigorous, with good tone and HR>100. Infant left with mother and OB team to complete transition.    Maternal Medications Eval:  current maternal medications compatible with breastfeeding .  Contributing Baby Medical History: Newborn     Plans to BF for greater than 6 months.  Breastfeeding History: experienced mother, breastfed other children for 6 months to 1 year.  Breast Shield/Flange Size: not assessed at this time  Insurance: not on file    Maternal Exam:  Breast Assessment: no maternal concerns.Did not assess at this time  Nipple Assessment: no maternal concerns.Did not assess at this time    Newborn Assessment   First Name: Courtney  Sex: female   GA: 13 6/7     Ballard:    Apgars: 6 and 9  Disposition: Newborn Nursery  Pediatrician: Unknown, Provider  Ped Phone: None     Infant Weight:      Birth Weight: 2850 g (6 lb 4.5 oz)     Today's Weight: 2845 g (6 lb 4.4 oz)  Percent Weight Change: -0.18 %      Feedings: formula     Quality of breast feeds:  N/A at this time.    Oral Assessment: lift and movement of tongue within normal limits, palate within normal limits   HATLLF 6    Maternal and Newborn Individual Risk Factors impacting Breastfeeding:  C Section Delivery    Interventions and Instructions   Spoke with Mother in her room [id number]. Mother stated she plans on breast feeding. Mom has breast fed her other 10 children. Mother understands the importance of breast stimulation/time at breast to aid in establishing her supply. Mother rested overnight: infant formula fed. Encouraged Mother to call for assistance with next feeding.Offered ongoing lactation support.       Plan   Follow-up with lactation daily while mother/infant dyad inpatient or by maternal request.  Length of this call/visit: 10-15 minutes     Feeding Plan:   Breast feed baby with early feeding cues, can be every 1-3 hours.  Use waking techniques &skin to skin, stimulate as needed to keep vigorous at the breast.   Goal is 8-12 feedings a day & maintain a deep comfortable latch.        Donalda Ewings, RN  Lactation Consultant

## 2017-11-04 NOTE — Plan of Care (Signed)
Problem: C-Section Postpartum Care  Goal: Patient is able to void/empty bladder after catheter is removed  Outcome: Progressing towards goal     Problem: C-Section Postpartum Care  Goal: Vital signs are medically acceptable  Outcome: Maintaining  Goal: Respiratory - Lungs clear to auscultation  Outcome: Maintaining  Goal: Cardiovascular - Homan's sign negative  Outcome: Maintaining  Goal: Patient will remain free of falls  Outcome: Maintaining  Goal: Dressing intact until removed with any drainage marked  Outcome: Maintaining  Goal: Fundus firm at midline  Outcome: Maintaining  Goal: Moderate rubra freeflow, no purulent discharge, no foul smelling lochia  Outcome: Maintaining  Goal: Urine output is 30 ml/hour or more  Description  Urinary Catheter is draining yellow urine 30 ML/hour or more.  Outcome: Maintaining  Goal: Breasts are soft with nipple integrity intact  Outcome: Maintaining  Goal: Demonstrates appropriate breast feeding techniques  Outcome: Maintaining  Goal: Positive Mother-Baby interactions are observed  Outcome: Maintaining  Goal: Ambulates independently  Outcome: Maintaining     Problem: Pain  Goal: Patient's pain/discomfort is manageable  Outcome: Maintaining  Goal: Report decrease in pain level  Description  Alleviation of pain or a reduction in pain to a level of comfort that is acceptable to the patient.  Outcome: Maintaining     Problem: Knowledge Deficit  Goal: Patient/S.O. demonstrates understanding of disease process, treatment plan, medications, and discharge instructions.  Outcome: Maintaining     Problem: Safety - OB  Goal: Follow unit's safety guidelines  Outcome: Maintaining     Problem: Newborn Care  Goal: Vital signs are medically acceptable  Outcome: Maintaining  Goal: Thermoregulation maintained per guidelines  Outcome: Maintaining  Goal: Infant exhibits minimal/reduced signs of pain/discomfort  Outcome: Maintaining  Goal: Infant is maintained in safe environment  Outcome:  Maintaining  Goal: Baby is with Mother and family  Outcome: Maintaining     Problem: Safety  Goal: Patient will remain free of falls  Outcome: Maintaining     Problem: Pain/Comfort  Goal: Patient's pain or discomfort is manageable  Outcome: Maintaining     Problem: Nutrition  Goal: Patient's nutritional status is maintained or improved  Outcome: Maintaining     Problem: Mobility  Goal: Patient's functional status is maintained or improved  Outcome: Maintaining     Problem: Psychosocial  Goal: Demonstrates ability to cope with illness  Outcome: Maintaining     Problem: Cognitive function  Goal: Cognitive function will be maintained or return to baseline  Outcome: Maintaining

## 2017-11-04 NOTE — Anesthesia Postprocedure Evaluation (Signed)
Anesthesia Post-Op Note    Patient: Courtney Lambert    Procedure(s) Performed:  Procedure Summary  Date:  11/03/2017 Anesthesia Start: 11/03/2017  4:26 PM Anesthesia Stop: 11/03/2017  6:54 PM Room / Location:  S_OR_OB-A / [facility] L&D   Procedure(s):  C-SECTION  EXTERNAL CEPHALIC VERSION Diagnosis:  * No pre-op diagnosis entered * Surgeon(s):  Thornburg, Gilman Buttner, MD  Webb Silversmith, MD  Mosetta Pigeon, MD Attending Anesthesiologist:  Landry Mellow, MD         Recovery Vitals  BP: 125/81 (11/04/2017  2:43 PM)  Heart Rate: 82 (11/04/2017  2:43 PM)  Resp: 20 (11/04/2017  2:43 PM)  Temp: 36.1 C (97 F) (11/04/2017  2:43 PM)  SpO2: 100 % (11/04/2017  2:43 PM)   0-10 Scale: 5 (11/04/2017  1:59 PM)    Anesthesia type:  Spinal  Complications Noted During Procedure or in PACU:  None   Comment:    Patient Location:  [facility] Floor  Level of Consciousness:    Recovered to baseline  Patient Participation:     Able to participate  Temperature Status:    Normothermic  Oxygen Saturation:    Within patient's normal range  Cardiac Status:   Within patient's normal range  Fluid Status:    Stable  Airway Patency:     Yes  Pulmonary Status:    Baseline  Neuraxial Block Evaluation:    No residual motor or sensory symptoms  Pain Management:    Adequate analgesia  Nausea and Vomiting:  None    Post Op Assessment:    Tolerated procedure well"   Attending Attestation:  All indicated post anesthesia care provided       -

## 2017-11-04 NOTE — Plan of Care (Signed)
Problem: C-Section Postpartum Care  Goal: Vital signs are medically acceptable  Outcome: Progressing towards goal  Goal: Respiratory - Lungs clear to auscultation  Outcome: Progressing towards goal  Goal: Cardiovascular - Homan's sign negative  Outcome: Progressing towards goal  Goal: Patient will remain free of falls  Outcome: Progressing towards goal  Goal: Dressing intact until removed with any drainage marked  Outcome: Progressing towards goal  Goal: Fundus firm at midline  Outcome: Progressing towards goal  Goal: Moderate rubra freeflow, no purulent discharge, no foul smelling lochia  Outcome: Progressing towards goal  Goal: Urine output is 30 ml/hour or more  Description: Urinary Catheter is draining yellow urine 30 ML/hour or more.  Outcome: Progressing towards goal  Goal: Breasts are soft with nipple integrity intact  Outcome: Progressing towards goal  Goal: Demonstrates appropriate breast feeding techniques  Outcome: Progressing towards goal  Goal: Patient is able to void/empty bladder after catheter is removed  Outcome: Progressing towards goal  Goal: Positive Mother-Baby interactions are observed  Outcome: Progressing towards goal  Goal: Ambulates independently  Outcome: Progressing towards goal     Problem: Pain  Goal: Patient's pain/discomfort is manageable  Outcome: Progressing towards goal  Goal: Report decrease in pain level  Description: Alleviation of pain or a reduction in pain to a level of comfort that is acceptable to the patient.  Outcome: Progressing towards goal     Problem: Knowledge Deficit  Goal: Patient/S.O. demonstrates understanding of disease process, treatment plan, medications, and discharge instructions.  Outcome: Progressing towards goal     Problem: Safety - OB  Goal: Follow unit's safety guidelines  Outcome: Progressing towards goal     Problem: Newborn Care  Goal: Vital signs are medically acceptable  Outcome: Progressing towards goal  Goal: Thermoregulation maintained per  guidelines  Outcome: Progressing towards goal  Goal: Infant exhibits minimal/reduced signs of pain/discomfort  Outcome: Progressing towards goal  Goal: Infant is maintained in safe environment  Outcome: Progressing towards goal  Goal: Baby is with Mother and family  Outcome: Progressing towards goal

## 2017-11-05 MED ORDER — SENNOSIDES 8.6 MG PO TABS *I*
1.0000 | ORAL_TABLET | Freq: Every day | ORAL | Status: DC
Start: 2017-11-05 — End: 2017-11-06
  Administered 2017-11-05 – 2017-11-06 (×2): 1 via ORAL
  Filled 2017-11-05 (×2): qty 1

## 2017-11-05 MED ORDER — DOCUSATE SODIUM 100 MG PO CAPS *I*
100.0000 mg | ORAL_CAPSULE | Freq: Two times a day (BID) | ORAL | Status: DC
Start: 2017-11-05 — End: 2017-11-06
  Administered 2017-11-05 – 2017-11-06 (×3): 100 mg via ORAL
  Filled 2017-11-05 (×3): qty 1

## 2017-11-05 NOTE — Plan of Care (Signed)
Problem: C-Section Postpartum Care  Goal: Vital signs are medically acceptable  Outcome: Maintaining  Goal: Respiratory - Lungs clear to auscultation  Outcome: Maintaining  Goal: Cardiovascular - Homan's sign negative  Outcome: Maintaining  Goal: Patient will remain free of falls  Outcome: Maintaining  Goal: Dressing intact until removed with any drainage marked  Outcome: Maintaining  Goal: Fundus firm at midline  Outcome: Maintaining  Goal: Moderate rubra freeflow, no purulent discharge, no foul smelling lochia  Outcome: Maintaining  Goal: Urine output is 30 ml/hour or more  Description  Urinary Catheter is draining yellow urine 30 ML/hour or more.  Outcome: Maintaining  Goal: Breasts are soft with nipple integrity intact  Outcome: Maintaining  Goal: Demonstrates appropriate breast feeding techniques  Outcome: Maintaining  Goal: Patient is able to void/empty bladder after catheter is removed  Outcome: Maintaining  Goal: Positive Mother-Baby interactions are observed  Outcome: Maintaining  Goal: Ambulates independently  Outcome: Maintaining     Problem: Pain  Goal: Patient's pain/discomfort is manageable  Outcome: Maintaining  Goal: Report decrease in pain level  Description  Alleviation of pain or a reduction in pain to a level of comfort that is acceptable to the patient.  Outcome: Maintaining     Problem: Knowledge Deficit  Goal: Patient/S.O. demonstrates understanding of disease process, treatment plan, medications, and discharge instructions.  Outcome: Maintaining     Problem: Safety - OB  Goal: Follow unit's safety guidelines  Outcome: Maintaining     Problem: Newborn Care  Goal: Vital signs are medically acceptable  Outcome: Maintaining  Goal: Thermoregulation maintained per guidelines  Outcome: Maintaining  Goal: Infant exhibits minimal/reduced signs of pain/discomfort  Outcome: Maintaining  Goal: Infant is maintained in safe environment  Outcome: Maintaining  Goal: Baby is with Mother and family  Outcome:  Maintaining     Problem: Safety  Goal: Patient will remain free of falls  Outcome: Maintaining     Problem: Pain/Comfort  Goal: Patient's pain or discomfort is manageable  Outcome: Maintaining     Problem: Nutrition  Goal: Patient's nutritional status is maintained or improved  Outcome: Maintaining     Problem: Mobility  Goal: Patient's functional status is maintained or improved  Outcome: Maintaining     Problem: Psychosocial  Goal: Demonstrates ability to cope with illness  Outcome: Maintaining     Problem: Cognitive function  Goal: Cognitive function will be maintained or return to baseline  Outcome: Maintaining

## 2017-11-05 NOTE — Progress Notes (Signed)
11/05/17 0800   [facility] Patient Class Review   Patient Class Review Inpatient   Patient Class Effective 10/31/17    Vic Blackbird  RN   Utilization Managent  [phone]   Pager:  [phone]

## 2017-11-05 NOTE — Lactation Note (Signed)
This note was copied from a baby's chart.  Lactation Consultant Daily Visit   Patient: Courtney Lambert " "        Age: 39 days              Corrected GA: 38w 1d  MRN: [id number]      Interventions and Instructions   1300 LC visit:  Writer paged to see mom while family is there to assist with translation. Mom states has done breast and formula with all her other children, typically breastfeeds for 2 years. Discussed with mom that when formula is offered it is a good idea to pump breasts, set mom up with a double pump, had mom pump 15", reassured mom that it is normal not to collect breastmilk with a pump and that she will have plenty of breastmilk since she breastfed 11 other children. Mom has [employer] insurance, discussed and gave family information on how to order a pump for mom through [employer]. No further breastfeeding needs at this time, mom is aware of how to call for assist.    Plan   Follow-up with lactation daily while mother/infant dyad inpatient or by maternal request.  Length of this call/visit: 15-20 minutes     Feeding Plan:   Goal is to get 8-12 feedings in 24 hours.   Observing for signs of hunger and satisfaction.   Keep baby deeply latched to prevent soreness and maximize the amount of milk transferred.   When formula is offered , mom is encouraged to pump.    Alfredia Client, RN  Lactation Consultant

## 2017-11-05 NOTE — Lactation Note (Signed)
This note was copied from a baby's chart.  Lactation Consultant Daily Visit   Patient: Courtney Lambert " "        Age: 39 days              Corrected GA: 38w 1d  MRN: [id number]      Maternal Information   Mothers Name:   Moet Mikulski    Visit Type: routine/daily     Maternal Medications: current maternal medications compatible with breastfeeding     Breast Assessment: mild filling    Nipple Assessment: no maternal concerns    Newborn Assessment   Infant Weight:      Birth Weight: 2850 g (6 lb 4.5 oz)     Today's Weight: 2765 g (6 lb 1.5 oz)     Percent Weight Change: -2.98 %      Feedings: newborn to breast  formula feeding also. Mom has done this with all her babies    Quality of breast feeds:  infant breastfeeding well, as observed by LC.    Oral Assessment: lift and movement of tongue within normal limits -per previous assessment  Maternal and Newborn Individual Risk Factors impacting Breastfeeding:   C Section Delivery    Interventions and Instructions   LC visit @ noon:  Writer stopped to see mom, trying to breastfeed, but mom has baby tightly swadled. Demonstrated to mom how to unwrap baby, but baby showing no feeding cues. Mom will page writer when her husband arrives for further translation.    Plan   Follow-up with lactation daily while mother/infant dyad inpatient or by maternal request.  Length of this call/visit: 10-15 minutes     Feeding Plan:    Goal is to get 8-12 feedings in 24 hours.   Observing for signs of hunger and satisfaction.   Keep baby deeply latched to prevent soreness and maximize the amount of milk transferred.       Alfredia Client, RN  Lactation Consultant

## 2017-11-05 NOTE — Progress Notes (Signed)
OBSTETRICS CESAREAN DELIVERY POST-OP PROGRESS NOTE   Postpartum Day: 2    Subjective     Courtney Lambert is doing well this morning. She continues to have diffuse pain overlying her entire abdomen. She has not yet had a bowel movement, however, unable to assess if she is passing gas at this time, given interpreter not present. Eating and drinking without nausea or vomiting. Voiding without difficulty.  Denies chest pain, SOB, or lightheadedness.       Objective     Vitals:    11/04/17 1443 11/04/17 1939 11/05/17 0144 11/05/17 0500   BP: 125/81 133/81 114/72 121/78   Pulse: 82 79 79 79   Resp: 20 18 18 18    Temp: 36.1 C (97 F) 36.3 C (97.3 F) 36.5 C (97.7 F) 36.4 C (97.5 F)   TempSrc: Temporal Temporal     SpO2: 100% 99% 100% 99%       Urine Output: Urine output: 200 cc over last 8 hours = 50cc/hour    Mental Status: Alert and oriented x 3  Cardiovascular: Regular rate and rhythm with no murmurs  Respiratory: Clear to auscultate  Abdomen: Soft, appropriately tender, nondistended, +BS  Incision: dressing removed, incision clean, dry and intact  Fundus: Fundus firm at umbilicus  Extremities/Skin: Minimal edema of pedal and pretibial (1+)      Labs     Recent Labs   Lab 11/04/17  0419 11/03/17  1436 10/31/17  1705   Hematocrit 31* 35 36       ABO RH Blood Type (no units)   Date Value   11/03/2017 O RH POS                    Rubella IgG AB (no units)   Date Value   07/25/2017 POSITIVE           Assessment & Plan     Courtney Lambert is a 37 y.o. [id number] on POD# 2 s/p repeat C-Sec, Low Transverse  at [redacted]w[redacted]d for failed ECV and  BPP 4/8.  Doing well.    Routine postoperative care:  - Pain management: toradol -> motrin, tylenol, oxycodone PRN  - DVT Prophylaxis: SCDs. Early ambulation.  - UOP: adequate, voiding spontaneously  - FEN: Regular diet.    - PPHct as above. Ferrous sulfate is not indicated on discharge.    Post-op  - EBL 600  - pain: toradol -> motrin, tylenol, oxy PRN    Uterine atony  - 100cc in PACU  - total EBL 700  -  s/p buccal miso  - bleeding stable   - post-op HCT 31    gHTN  - normotensive overnight    Asthma  - ventolin    History of 3 prior cesarean sections   - In 2009, 2014, 2015     Hx PPH    History of Prior Stillbirth - 2009    Postpartum care:  - Rh status as above. Rhogam is not indicated.    - Infant: female    Disposition: Plan for D/C home POD # 3.    Keene Breath, MD   Ob/Gyn R3  [phone] 5:36 AM 11/05/2017

## 2017-11-06 ENCOUNTER — Encounter: Payer: Self-pay | Admitting: Obstetrics and Gynecology

## 2017-11-06 DIAGNOSIS — Z30017 Encounter for initial prescription of implantable subdermal contraceptive: Secondary | ICD-10-CM

## 2017-11-06 MED ORDER — ETONOGESTREL 68 MG SC IMPL *I*
68.0000 mg | DRUG_IMPLANT | SUBCUTANEOUS | Status: AC
Start: 2017-11-06 — End: 2017-11-06
  Administered 2017-11-06: 68 mg via INTRADERMAL
  Filled 2017-11-06: qty 1

## 2017-11-06 MED ORDER — LIDOCAINE HCL 1 % IJ SOLN *I*
5.0000 mL | Freq: Once | INTRAMUSCULAR | Status: AC | PRN
Start: 2017-11-06 — End: 2017-11-06
  Administered 2017-11-06: 5 mL via SUBCUTANEOUS
  Filled 2017-11-06: qty 20

## 2017-11-06 NOTE — Progress Notes (Signed)
OBSTETRICS CESAREAN DELIVERY POST-OP PROGRESS NOTE   Postpartum Day: 3    Subjective     Courtney Lambert is doing well this morning. Pain did allright overnight. She has not yet had a bowel movement, however, unable to assess if she is passing gas at this time, given interpreter not present. Eating and drinking without nausea or vomiting.  Tolerating diet.  Denies SOB, chest pain, or lightheadedness.       Objective     Vitals:    11/05/17 1632 11/05/17 2007 11/06/17 0105 11/06/17 0245   BP:  (!) 139/97 (!) 143/86    BP Location:   Right arm    Pulse:  (!) 111 95    Resp: 18 18 18 18    Temp:  36.4 C (97.5 F) 36.3 C (97.3 F)    TempSrc:  Temporal Temporal    SpO2:  98% 99%        Mental Status: Alert and oriented x 3  Cardiovascular: Regular rate and rhythm with no murmurs  Respiratory: Clear to auscultate  Abdomen: Soft, appropriately tender, nondistended, +BS  Incision: dressing removed, incision clean, dry and intact  Fundus: Fundus firm at umbilicus  Extremities/Skin: Minimal edema of pedal and pretibial (1+)      Labs     Recent Labs   Lab 11/04/17  0419 11/03/17  1436 10/31/17  1705   Hematocrit 31* 35 36       ABO RH Blood Type (no units)   Date Value   11/03/2017 O RH POS                    Rubella IgG AB (no units)   Date Value   07/25/2017 POSITIVE           Assessment & Plan     Courtney Lambert is a 37 y.o. [id number] on POD# 3 s/p repeat C-Sec, Low Transverse  at [redacted]w[redacted]d for failed ECV and  BPP 4/8.  Doing well.    Routine postoperative care:  - Pain management: toradol -> motrin, tylenol, oxycodone PRN  - DVT Prophylaxis: SCDs. Early ambulation.  - UOP: adequate, voiding spontaneously  - FEN: Regular diet.    - PPHct as above. Ferrous sulfate is not indicated on discharge.    Post-op  - EBL 600  - pain: toradol -> motrin, tylenol, oxy PRN    Uterine atony  - 100cc in PACU  - total EBL 700  - s/p buccal miso  - bleeding stable   - post-op HCT 31    gHTN  - normal to mild overnight    Asthma  - ventolin    History of  Prior Stillbirth - 2009    Postpartum care:  - Rh status as above. Rhogam is not indicated.    - Infant: female    Disposition: Plan for D/C home POD # 3.    Adaline Sill, MD  OB/GYN R2  Pager: [phone]

## 2017-11-06 NOTE — Lactation Note (Signed)
This note was copied from a baby's chart.  Department of Lactation and Breastfeeding Medicine  Congratulations on the birth of your baby and your decision to provide the benefits of breast milk.    Birth Weight: 2850 g (6 lb 4.5 oz)   Discharge: Weight: 2725 g (6 lb 0.1 oz)    Weight Change: -4% from birth weight  Your infant is on the 36 percentile growth curve      Feeding:  Your baby is breastfeeding very well, and we know that breastfeeding alone for the first 6 months of life is best for you and your baby. Then, you can add solid foods and keep breastfeeding for at least 1-2 years! If you are worried about your babys growth, ask your babys doctor right away. Frequency is important so continue to aim for 8 to 12 times per 24 hours (or every 1-3 hours). Babies often show feeding cues (listed below) about every 1-3 hours all day, evening, and night for the first 2 weeks or so! This will end!  Feeding often the first few week(s) will help you develop a great supply and then your baby will begin to take larger meals!!  Keep baby deeply latched to prevent sore nipples and this will increase your milk transfer to baby.  Pump if your baby is not vigorous at transferring milk or if baby receives a bottle.     Remember to stimulate breasts at least 8 times per day with either baby to breast or pumping to establish and maintain your milk supply. Once the baby latches, nurse until baby shows signs of satisfaction. This may VARY UP TO 20 MINUTES A BREAST. Watch for a rhythmic pattern:  2-3 or more sucks per swallow, pause of less than 10 seconds, no sounds other than swallowing, jaw movement with wiggle of ear or temple area and no dimpling in of cheeks.  If baby does not nurse at least a total of 10 minutes, use waking techniques and relatch onto breast or try switch nursing from one breast to the other and keep baby vigorous during feeding.     Positioning:  Make sure you get as much breast tissue into the baby's  mouth as possible by starting the baby away from your areola and having your nipple near the baby's nose.  Wait for her mouth to open and then roll her up and over your nipple into your breast.  DO NOT touch the back of her head.  The baby will want to push her head against your hand rather than into your breast!  Have a lactation consultant help you if you need help.  A deep latch will prevent sore nipples!     Feeding Cues:  Hand to mouth movements, stretching movements, making sounds, lip smacking, licking, and rapid eye movement under closed lids. Crying is a late cue, baby may need to be calmed before she will latch.     Tips to [facility] Baby:  Unwrap and/or undress baby, change diaper, rock baby foward & back, stroke baby's back, stroke baby's lips with your fingers or baby's fingers. If baby does not wake up within 20 minutes, try again in 1 hour or sooner if baby shows feeding cues.     Breast Engorgement:  When milk supply increases, breasts may become firm and baby could have a difficult time latching.  You may manually express or pump 3 to 5 minutes to soften the nipple and areola before latching the baby. You can  hand express to comfort after feeding baby if necessary. Wear a supportive bra for comfort.        Milk Storage:  Countertop, Table Room temperature up to 77 F 6-8 hours Containers should be covered and kept as cool as possible   Insulated cooler bag 5-39 F 24 hours Keep freezer packs in contact with milk containers, limit opening cooler bag   Refrigerator 39 F 5 days Store milk in back of the main body of the refrigerator   Freezer compartment of a refrigerator 5 F 2 weeks Store milk toward the back of the freezer where temperature is most constant.     Freezer compartment of a refrigerator with separate doors 0 F 3-6 months Milk stored for longer durations is safe but some of the lipids (fat) in the milk can degrade resulting in lower quality milk.   Chest or upright deep freezer -4 F 6-12  months    Reference: Academy of Breastfeeding Medicine (2004)    Breastfeeding Support Groups     ...build community, safe spaces, ask the uncomfortable questions!  may help you breastfeed longer!      [facility] "Your goal- Your Journey"  Held every Wednesday of each month.  Drop In group   Pre and Post feed weights   6:00-7:30p  Light refreshments will be served.   Location: [facility], [address] [place] [address]      [employer] Cafs   Free parking and bus passes provided. Light refreshments will be served.   3 Locations:  1. Tues: [facility]), [address] 2nd Tuesday of every month 10:30a-12p  2. Weds: [facility], [address], 1st and 3rd Wednesday of every month 1:00- 3:00p  3. Thurs: [facility] [place], [address], 1st Thurs of the month, 12:30-2pm    ALL [place] Baby Cafe's at :   LargeFood.co.uk   ?        Others:    [facility]  [address] [place], [place], [place] [address]  email [email or web address] [email or web address] with any questions or for more information    Breastfeeding Cafe meets every Wednesday at 10 AM and one Saturday a month at 10 AM. See website for dates.     [email or web address].no    Life with Baby meets every Thursday at 10 AM and one Saturday a month at 10 AM. See website for dates.    LiveGrowth.co.nz    [employer] League Groups:   www.ElectroFunds.gl.  [employer] Leaders are experienced mothers who are accredited by [employer] to help mothers and mothers-to-be with all aspects of breastfeeding.   Morning Meetings:  [employer] meets in [place] on the third Thursday of each month at 10:00 a.m. Contact Dorothy and Meghan at [email or web address] .[email or web address].   Brien Mates [phone] Paula [phone]  [place] meets in [place] on the second Wednesday of each month at 9:45 a.m. Contact Dorothy  at [employer] .com; Lora Paula [phone]  Evening Meetings:   [facility] meets in downtown [place] on the fourth Thursday of the month at 6:30 p.m. Contact Beverly at [email or web address]; Loyal Jacobson [phone]  [facility] [address] ([place] generally meets in [place] on the third Monday of each month at 6:30 p.m. Contact Loree Fee at [facility] .com; Reymundo Poll [phone]  [facility] meets in [place] on the third Thursday of the month at 6:30 p.m.  Contact Maureen at [phone].   [place] [facility] [place]) Furniture conservator/restorer; [place] [phone]      [facility] Groups and Peer Counseling:  Breastfeeding Peer Counselors and Drop in Groups  Contact: [phone]    Online facebook groups:      BBC Breastfeeding Cafe and Breastfeeding Support Group   Black Women Do Breastfeed   Exclusively Pumping Moms- Exclusive Group   [place] [address] of [place], [place]   [place] [address]  of [place]    [place] Milk Support Group   [employer] Nurslings    Follow up: For urgent concerns or medication related questions call your pediatrician.   Other resources: [place] [facility]feeding [facility]: http://[place]regionalbreastfeedingcoalition.[email or web address]/parents-families/   [facility] [address] [phone]) [email or web address]      Chong Sicilian, RN  Lactation Counselor

## 2017-11-06 NOTE — Plan of Care (Signed)
All care plan goals have been met, or are adequate for discharge.    Problem: C-Section Postpartum Care  Goal: Vital signs are medically acceptable  Outcome: Adequate for discharge  Goal: Respiratory - Lungs clear to auscultation  Outcome: Adequate for discharge  Goal: Cardiovascular - Homan's sign negative  Outcome: Adequate for discharge  Goal: Patient will remain free of falls  Outcome: Adequate for discharge  Goal: Fundus firm at midline  Outcome: Adequate for discharge  Goal: Moderate rubra freeflow, no purulent discharge, no foul smelling lochia  Outcome: Adequate for discharge  Goal: Breasts are soft with nipple integrity intact  Outcome: Adequate for discharge  Goal: Demonstrates appropriate breast feeding techniques  Outcome: Adequate for discharge  Goal: Ambulates independently  Outcome: Adequate for discharge     Problem: Pain  Goal: Patient's pain/discomfort is manageable  Outcome: Adequate for discharge  Goal: Report decrease in pain level  Description  Alleviation of pain or a reduction in pain to a level of comfort that is acceptable to the patient.  Outcome: Adequate for discharge     Problem: Knowledge Deficit  Goal: Patient/S.O. demonstrates understanding of disease process, treatment plan, medications, and discharge instructions.  Outcome: Adequate for discharge     Problem: Safety - OB  Goal: Follow unit's safety guidelines  Outcome: Adequate for discharge     Problem: Newborn Care  Goal: Vital signs are medically acceptable  Outcome: Adequate for discharge  Goal: Thermoregulation maintained per guidelines  Outcome: Adequate for discharge  Goal: Infant exhibits minimal/reduced signs of pain/discomfort  Outcome: Adequate for discharge  Goal: Infant is maintained in safe environment  Outcome: Adequate for discharge  Goal: Baby is with Mother and family  Outcome: Adequate for discharge     Problem: Safety  Goal: Patient will remain free of falls  Outcome: Adequate for discharge     Problem:  Pain/Comfort  Goal: Patient's pain or discomfort is manageable  Outcome: Adequate for discharge     Problem: Nutrition  Goal: Patient's nutritional status is maintained or improved  Outcome: Adequate for discharge     Problem: Mobility  Goal: Patient's functional status is maintained or improved  Outcome: Adequate for discharge     Problem: Psychosocial  Goal: Demonstrates ability to cope with illness  Outcome: Adequate for discharge     Problem: Cognitive function  Goal: Cognitive function will be maintained or return to baseline  Outcome: Adequate for discharge

## 2017-11-06 NOTE — Progress Notes (Signed)
Pt aware of and agreeable to plan for discharge.  No PIV access at time of discharge.  Prescriptions complete.  Discharge instructions reviewed with pt and her husband who translated (dialect not available on interpreter phone) and pt verbalized understanding.    Fredna Dow, RN

## 2017-11-06 NOTE — Discharge Summary (Signed)
Name: Courtney Lambert MRN: [id number] DOB: May 09, 1980     Admit Date: 11/03/2017   Date of Discharge: 11/06/2017     Patient was accepted for discharge to   Home or Self Care [1]           Discharge Attending Physician: Raylene Everts, MD      Hospitalization Summary    CONCISE NARRATIVE:   37 year old G12 11009, now [id number] who presented at [redacted]w[redacted]d for delivery for [facility] 4/8. Of note, she was recently admitted to [facility] from 10/11-10/12 for equivocal NST BPP 4/8. Induction was recommended, and she had a cervical foley placed and was subsequently found to have fetal breech presentation. She had a reassuring tracing overnight and repeat BPP 8/8, and she was discharged with plan for outpatient delivery planning.     She was seen for her prenatal visit on the day of presentation, and had a repeat BPP 4/8, and was [facility]. Pregnancy risks include single elevated BP and hx of LTCS x 3 with successful VBAC. She was counseled on her options and opted for attempted ECV. She received spinal anesthesia and had an unsuccessful EVC attempt, followed by a repeat LTCS and delivery of a viable female infant. Her post-partum course was uncomplicated. She received a nexplanon prior to discharge and went home in good condition on post-operative day 3.           OR PROCEDURE: 11/03/17: failed external cephalic version and repeat low transverse cesarean section                   Signed: Dimas Chyle, MD  On: 11/06/2017  at: 4:35 PM

## 2017-11-06 NOTE — Procedures (Signed)
Nexplanon Insertion Note    Courtney Lambert is a 37 y.o. female, [id number] who is postpartum day #3 and desires immediate postpartum Nexplanon insertion.  She has previously used nexplanon for birth control.    Consent:  The procedure risks, benefits, complications and possible alternatives were discussed with the patient. All questions were answered prior to the patient signing the informed consent.    Pre-Procedural Time Out:  11/06/2017                                         1:30 PM    Correct Procedure: Yes  Correct Patient: (use 2 Identifiers) Yes  Correct Site: Yes  Site marked: Yes  Correct Patient Position: Yes  Appropriate Hand Hygiene Used: Yes  List Any Participants Involved in Time-Out: patient, Ahilyn Nell Gerrianne Scale, MD  Availability of correct implants and any special equipment: Yes    Procedure Details  The patient's upper left arm was prepped with alcohol, and the insertion site 8-10 cm proximal to the medial epicondyl was injected with approximately 4 cc of 1% lidocaine. The Nexplanon device was inserted subdermally in the crease between the biceps and triceps muscles in the usual manner without difficulty.  Patient and provider palpated the rod just under the skin. A sterile dressing was applied. Patient tolerated the procedure well. Blood loss was minimal.    Lot #: [id number]  Expiration date: 2021 Nov 25  Removal date: 20202 Oct    Post procedure pain rated as 0/10.    Assessment:   37 y.o. [id number] on PPD# 3, now s/p Nexplanon contraceptive insertion.    Plan:  Post insertion instructions were reviewed with the patient and written information was also provided.    The plan was reviewed with the patient including:  - Her arm will be achy for about 4 days.  - Her arm will have mild swelling and a bruise as it heals.  - Keep the dressing on for a day.  - Keep the area clean and dry.  - No heavy lifting with that arm for a day.  - Take acetaminophen (Tylenol) or ibuprofen (Motrin, Advil) for the  soreness  - The patient will follow up at her postpartum visit    She was advised to call if she has any of the following:  - fever  - excessive swelling at the site  - severe pain in her arm  - bright redness at the insertion site    All questions were answered and the patient stated a good understanding of instructions.      Dimas Chyle, MD  11/06/2017  1:30 PM

## 2017-11-07 ENCOUNTER — Telehealth: Payer: Self-pay

## 2017-11-07 NOTE — Telephone Encounter (Signed)
Writer called pt to schedule wound ck via cyracom left vm with appt time and date

## 2017-11-10 NOTE — Telephone Encounter (Signed)
Writer called to inform pt of appt on 10/23 at 230 using interpreter services [phone]

## 2017-11-12 ENCOUNTER — Ambulatory Visit: Payer: MEDICAID | Attending: Obstetrics and Gynecology | Admitting: Obstetrics and Gynecology

## 2017-11-12 ENCOUNTER — Telehealth: Payer: Self-pay

## 2017-11-12 DIAGNOSIS — Z5189 Encounter for other specified aftercare: Secondary | ICD-10-CM

## 2017-11-12 MED ORDER — BREAST PUMP MISC *A*
0 refills | Status: AC
Start: 2017-11-12 — End: ?

## 2017-11-12 NOTE — Telephone Encounter (Signed)
Pt arrived significantly earlier then her scheduled appt, due to another cancellation we were able to move her up and used cyracom rather then in person per okay with client about not having to wait over an hour

## 2017-11-12 NOTE — Telephone Encounter (Signed)
Writer called pt to try to confirm PPM incision ck for today at 230 left vm with interpreter [phone]

## 2017-11-13 NOTE — Progress Notes (Addendum)
[facility]    Chief Complaint:     Postpartum Care    Subjective:     Courtney Lambert is a 37 y.o. [id number] female who presents for a wound check.    Delivery Information  Information for the patient's newborn:  Cyd Silence [[id number]]   Delivery Date: 11/03/2017 5:44 PM   Gestational Age: [redacted]w[redacted]d  Pregnancy Complications: None          Type of Delivery: C-sec, low transverse [1000]  Delivering Clinician(s): Burhans, Kristen  Labor Complications: None [0]  Additional Complications: None  Other Procedures: None    Living Status at Birth: Living [1]  Current Living Status: Alive [1]   Birthweight: 2850 g (6 lb 4.5 oz)      Evagelia speaks [place] but appears to understand some [place].  An interpreter phone was initially used but the interpreter hung up during the visit.  Her daughter translated for the remainder of the encounter.    The patient is s/p rLTCS at [redacted]w[redacted]d on 11/03/17 in the setting of NRFHT, SROM, and fetal malposition.  Her pregnancy was complicated by a Hx of LTCS x 3.  Her delivery and postpartum course were uncomplicated.  Nexplanon was placed for postpartum birth control prior to her discharge.    She present today for a wound check.  The patient is without questions or concerns. Vaginal bleeding is not heavy.  She reports having adequate assistance at home.      Her newborn daughter is doing well.  The patient is breast-feeding and would like a Rx for a pump.        The patient's Allergies, Meds, PMH, PSH, FH and SH were reviewed and updated as appropriate.     Review of Systems  All other ROS negative unless otherwise noted in HPI.     Objective:     Vitals:    11/12/17 1418   BP: (!) 150/83; repeat BP 142/92   Pulse: 75   Weight: 100.245 kg     Pain 0/10    Physical Exam   General: NAD, well appearing, pleasant, bright affect  Abdomen: Obese, soft, non-distended, Pfannenstiel incision CD&I without erythema, drainage, or tenderness      Assessment/Plan:      Patient is a [id number]  female s/p C-Sec, Low Transverse  here for wound check. She is doing well.    Pregnancy risks:   LTCS x 3  AMA  Asthma  Elevated BP in 3rd trimester     - Initial BP elevated; repeat improved and similar to BP's at previous visits.  The patient is without s/sx of pre-eclampsia.    - Wound healing well.  Reviewed s/sx of infection and wound care  - Without s/sx of postpartum depression and has strong support from family members  - Breast feeding going well.  Rx for breast-pump given.  - s/p Nexplanon    RTC in 5 weeks for postpartum visit.    Gwen Her, NP

## 2017-11-14 ENCOUNTER — Telehealth: Payer: Self-pay

## 2017-11-14 NOTE — Telephone Encounter (Signed)
Writer called to inform patient that she has been rescheduled to 10am on 11/27   cyracom interpreter [phone]

## 2017-11-21 ENCOUNTER — Other Ambulatory Visit
Admission: RE | Admit: 2017-11-21 | Discharge: 2017-11-21 | Disposition: A | Discharge status: Home / Self Care | Payer: Medicaid Other | Source: Ambulatory Visit | Attending: Obstetrics and Gynecology | Admitting: Obstetrics and Gynecology

## 2017-11-21 ENCOUNTER — Other Ambulatory Visit
Admission: RE | Admit: 2017-11-21 | Discharge: 2017-11-21 | Disposition: A | Discharge status: Home / Self Care | Payer: Medicaid Other | Source: Ambulatory Visit

## 2017-11-21 DIAGNOSIS — Z98891 History of uterine scar from previous surgery: Secondary | ICD-10-CM | POA: Insufficient documentation

## 2017-11-21 DIAGNOSIS — R030 Elevated blood-pressure reading, without diagnosis of hypertension: Secondary | ICD-10-CM | POA: Insufficient documentation

## 2017-11-21 DIAGNOSIS — O0990 Supervision of high risk pregnancy, unspecified, unspecified trimester: Secondary | ICD-10-CM | POA: Insufficient documentation

## 2017-11-21 LAB — COMPREHENSIVE METABOLIC PANEL
ALT: 14 U/L (ref 0–35)
ALT: 16 U/L (ref 0–35)
AST: 16 U/L (ref 0–35)
AST: 18 U/L (ref 0–35)
Albumin: 3.9 g/dL (ref 3.5–5.2)
Albumin: 4 g/dL (ref 3.5–5.2)
Alk Phos: 50 U/L (ref 35–105)
Alk Phos: 51 U/L (ref 35–105)
Anion Gap: 11 (ref 7–16)
Anion Gap: 11 (ref 7–16)
Bilirubin,Total: 0.4 mg/dL (ref 0.0–1.2)
Bilirubin,Total: 0.4 mg/dL (ref 0.0–1.2)
CO2: 26 mmol/L (ref 20–28)
CO2: 26 mmol/L (ref 20–28)
Calcium: 8.4 mg/dL — ABNORMAL LOW (ref 8.8–10.2)
Calcium: 8.4 mg/dL — ABNORMAL LOW (ref 8.8–10.2)
Chloride: 103 mmol/L (ref 96–108)
Chloride: 104 mmol/L (ref 96–108)
Creatinine: 0.49 mg/dL — ABNORMAL LOW (ref 0.51–0.95)
Creatinine: 0.5 mg/dL — ABNORMAL LOW (ref 0.51–0.95)
GFR,Black: 140 *
GFR,Black: 141 *
GFR,Caucasian: 122 *
GFR,Caucasian: 122 *
Glucose: 83 mg/dL (ref 60–99)
Glucose: 87 mg/dL (ref 60–99)
Lab: 11 mg/dL (ref 6–20)
Lab: 11 mg/dL (ref 6–20)
Potassium: 3.4 mmol/L (ref 3.3–5.1)
Potassium: 3.6 mmol/L (ref 3.3–5.1)
Sodium: 140 mmol/L (ref 133–145)
Sodium: 141 mmol/L (ref 133–145)
Total Protein: 7.2 g/dL (ref 6.3–7.7)
Total Protein: 7.3 g/dL (ref 6.3–7.7)

## 2017-11-21 LAB — CBC
Hematocrit: 32 % — ABNORMAL LOW (ref 34–45)
Hematocrit: 33 % — ABNORMAL LOW (ref 34–45)
Hemoglobin: 10.2 g/dL — ABNORMAL LOW (ref 11.2–15.7)
Hemoglobin: 10.2 g/dL — ABNORMAL LOW (ref 11.2–15.7)
MCH: 28 pg/cell (ref 26–32)
MCH: 28 pg/cell (ref 26–32)
MCHC: 31 g/dL — ABNORMAL LOW (ref 32–36)
MCHC: 32 g/dL (ref 32–36)
MCV: 89 fL (ref 79–95)
MCV: 89 fL (ref 79–95)
Platelets: 535 10*3/uL — ABNORMAL HIGH (ref 160–370)
Platelets: 536 10*3/uL — ABNORMAL HIGH (ref 160–370)
RBC: 3.6 MIL/uL — ABNORMAL LOW (ref 3.9–5.2)
RBC: 3.7 MIL/uL — ABNORMAL LOW (ref 3.9–5.2)
RDW: 13 % (ref 11.7–14.4)
RDW: 13.2 % (ref 11.7–14.4)
WBC: 5.1 10*3/uL (ref 4.0–10.0)
WBC: 5.1 10*3/uL (ref 4.0–10.0)

## 2017-11-21 LAB — MICROALBUMIN, URINE, RANDOM
Creatinine,UR: 104 mg/dL (ref 20–300)
Creatinine,UR: 62 mg/dL (ref 20–300)
Microalb/Creat Ratio: 2422 mg MA/g CR — ABNORMAL HIGH (ref 0.0–29.9)
Microalb/Creat Ratio: 1989.2 mg MA/g CR — ABNORMAL HIGH (ref 0.0–29.9)
Microalbumin,UR: 251.89 mg/dL
Microalbumin,UR: 123.33 mg/dL

## 2017-11-21 LAB — URIC ACID: Urate: 4.4 mg/dL (ref 2.7–6.8)

## 2017-11-21 LAB — URINALYSIS WITH REFLEX TO MICROSCOPIC
Ketones, UA: NEGATIVE
Leuk Esterase,UA: NEGATIVE
Nitrite,UA: NEGATIVE
Protein,UA: 100 mg/dL — AB
Specific Gravity,UA: 1.012 (ref 1.002–1.030)
pH,UA: 7 (ref 5.0–8.0)

## 2017-11-21 LAB — URINE MICROSCOPIC (IQ200)
RBC,UA: 1 /hpf (ref 0–2)
WBC,UA: 2 /hpf (ref 0–5)

## 2017-11-21 LAB — PROTEIN, URINE: Protein,UR: 174 mg/dL — ABNORMAL HIGH (ref 0–11)

## 2017-11-21 LAB — LACTATE DEHYDROGENASE: LD: 316 U/L — ABNORMAL HIGH (ref 118–225)

## 2017-11-24 ENCOUNTER — Other Ambulatory Visit: Payer: Self-pay | Admitting: Family Medicine

## 2017-11-24 DIAGNOSIS — M7989 Other specified soft tissue disorders: Secondary | ICD-10-CM

## 2017-12-03 ENCOUNTER — Other Ambulatory Visit: Payer: Medicaid Other

## 2017-12-17 ENCOUNTER — Emergency Department
Admission: EM | Admit: 2017-12-17 | Discharge: 2017-12-17 | Discharge status: Against Medical Advice | Payer: Medicaid Other | Source: Ambulatory Visit | Admitting: Emergency Medicine

## 2017-12-17 ENCOUNTER — Other Ambulatory Visit: Payer: Self-pay | Admitting: Cardiology

## 2017-12-17 ENCOUNTER — Observation Stay
Admission: RE | Admit: 2017-12-17 | Discharge: 2017-12-18 | Disposition: A | Discharge status: Home / Self Care | Payer: Medicaid Other | Source: Ambulatory Visit | Attending: Obstetrics and Gynecology | Admitting: Obstetrics and Gynecology

## 2017-12-17 ENCOUNTER — Ambulatory Visit: Payer: Medicaid Other | Admitting: Obstetrics and Gynecology

## 2017-12-17 ENCOUNTER — Ambulatory Visit: Payer: MEDICAID

## 2017-12-17 DIAGNOSIS — R9431 Abnormal electrocardiogram [ECG] [EKG]: Secondary | ICD-10-CM

## 2017-12-17 DIAGNOSIS — J45901 Unspecified asthma with (acute) exacerbation: Secondary | ICD-10-CM | POA: Insufficient documentation

## 2017-12-17 DIAGNOSIS — Z98891 History of uterine scar from previous surgery: Secondary | ICD-10-CM | POA: Insufficient documentation

## 2017-12-17 DIAGNOSIS — O169 Unspecified maternal hypertension, unspecified trimester: Secondary | ICD-10-CM

## 2017-12-17 DIAGNOSIS — O1405 Mild to moderate pre-eclampsia, complicating the puerperium: Principal | ICD-10-CM | POA: Insufficient documentation

## 2017-12-17 DIAGNOSIS — Z5321 Procedure and treatment not carried out due to patient leaving prior to being seen by health care provider: Secondary | ICD-10-CM | POA: Insufficient documentation

## 2017-12-17 DIAGNOSIS — J45909 Unspecified asthma, uncomplicated: Secondary | ICD-10-CM | POA: Diagnosis present

## 2017-12-17 DIAGNOSIS — O1490 Unspecified pre-eclampsia, unspecified trimester: Secondary | ICD-10-CM | POA: Diagnosis present

## 2017-12-17 DIAGNOSIS — O1493 Unspecified pre-eclampsia, third trimester: Secondary | ICD-10-CM

## 2017-12-17 HISTORY — DX: Unspecified pre-eclampsia, unspecified trimester: O14.90

## 2017-12-17 LAB — CBC
Hematocrit: 34 % (ref 34–45)
Hemoglobin: 10.6 g/dL — ABNORMAL LOW (ref 11.2–15.7)
MCH: 27 pg/cell (ref 26–32)
MCHC: 31 g/dL — ABNORMAL LOW (ref 32–36)
MCV: 86 fL (ref 79–95)
Platelets: 360 10*3/uL (ref 160–370)
RBC: 3.9 MIL/uL (ref 3.9–5.2)
RDW: 13.5 % (ref 11.7–14.4)
WBC: 6.5 10*3/uL (ref 4.0–10.0)

## 2017-12-17 LAB — PROTEIN,UR + CREAT,UR WITH RATIO
Creatinine,UR: 103 mg/dL (ref 20–300)
Protein,UR: 112 mg/dL — ABNORMAL HIGH (ref 0–11)
TP Creatinine ratio,UR: 1.09

## 2017-12-17 MED ORDER — ALBUTEROL SULFATE (2.5 MG/3ML) 0.083% IN NEBU *I*
2.5000 mg | INHALATION_SOLUTION | Freq: Once | RESPIRATORY_TRACT | Status: AC
Start: 2017-12-17 — End: 2017-12-17
  Administered 2017-12-17: 2.5 mg via RESPIRATORY_TRACT
  Filled 2017-12-17: qty 3

## 2017-12-17 NOTE — Progress Notes (Signed)
[facility] Postpartum Visit    HPI  Courtney Lambert is a 37 y.o. [id number] female who is 6 weeks postpartum from a repeat cesarean section at [redacted]w[redacted]d. She was diagnosed with gestational hypertension around the time of delivery. Her BPs were mildly elevated at her incision check visit. It appears that she had normal HELLP labs on 11/1, but her urine P:C was elevated (not cath specimen) at 2.8. On arrival, her BP was severely elevated today at 173/101. She reports having intermittent headaches and neck pain. She also reports that she is having pain around her umbilicus. She feels a bulge there and sometimes when she stands up she has sharp pains in her upper abdomen as well. She is tolerating a regular diet. No incisional pain. Breastfeeding baby. Mood stable. She has been following with her PCP at [facility] and she reports that she is scheduled to see someone next week for imaging related to her umbilical discomfort.     Patient's PMH, PSH, FH and SH were reviewed and updated as appropriate.       MEDICATIONS  Prior to Admission medications    Medication Sig Start Date End Date Taking? Authorizing Provider   breast pump Please dispense 1 electric pump.  Z39.1 Postpartum Lactating Mother.  Use as directed to express breast milk 11/12/17  Yes Calvaruso, Harlow Asa, NP   acetaminophen (TYLENOL) 500 mg tablet Take 2 tablets (1,000 mg total) by mouth 3 times daily as needed for Pain 11/03/17  Yes Mosetta Pigeon, MD   ibuprofen (ADVIL,MOTRIN) 600 MG tablet Take 1 tablet (600 mg total) by mouth 3 times daily as needed for Pain 11/03/17  Yes Mosetta Pigeon, MD                   VENTOLIN HFA 108 [phone] BASE) MCG/ACT inhaler  11/03/14   [provider]       REVIEW OF SYSTEMS  Gen: Denies unintended weight loss, weight gain, hot flashes, night sweats, fatigue, fevers/chills  HEENT: Reports headache and neck pain. No vision changes or hearing problems  CV: Denies chest pain, irregular heartbeat  Resp:  Denies wheezing, SOB, persistent cough  Breast: Denies lumps, nipple discharge, tenderness  FAO:ZHYQMVH abdominal discomfort. Denies nausea, vomiting, diarrhea, constipation, straining during BMs, bloody stool, fecal incontinence, bloating/gas  GU: Denies incontinence, urinary urgency, urinary frequency,  dysuria, nocturia, weak urine stream, difficulty voiding, incomplete emptying, hematuria  GYN: Denies vaginal dryness, bulge from vagina, loss of sex drive  MSK: Denies joint pain, muscle aches, muscle weakness, numbness/tingling, back pain  Skin: Denies rash, hair loss  Psych: Denies depression, stress/anxiety     PP EXAM  Vitals:    12/17/17 0950   BP: (!) 173/101   Pulse: 70   Weight: 101.1 kg (222 lb 12.8 oz)     General - alert, well appearing, and in no distress  Resp - Normal respiratory effort.  Breast - normal in size and symmetry    CV - normal rate and regular rhythm  Abdomen - Soft, incision clean/dry/intact, umbilicus with small bulge noted during valsalva, minimal tenderness to palpation  Pelvic - Normal-appearing vagina and cervix.   Extremities - No edema, no tenderness      ASSESSMENT/PLAN  Pt is a [id number] female s/p repeat cesarean section on 10/14 who now presents with severe range BPs.    1. Postpartum care  - Incision well healed   - Mood stable  - Breastfeeding baby  - Nexplanon  for contraception  - Appears to have a small umbilical hernia. Reports she is already scheduled to see someone for evaluation and management of this.     2. Severe range BP  - New finding in the setting of gestational hypertension. Repeat BP in mild range today.   - Proteinuria noted on sample earlier this month, but unclear if contaminated  - Reports having a headache today  - Though she is 6 weeks from delivery and preeclampsia is less likely this remote from delivery, it is still possible   - Ad[facility] labs and further management. Patient agreeable. Triage notified.   - Recommended  repeat BP check in 1 week. Patient [facility] to [facility]. Declines visit here.    3. Routine health maintenance  - Patient unsure of last GYN exam (moved here from [place])  - No record of pap smear   - Pap smear collected today    Courtney Mustyourtney Olson-Chen, MD  12/17/2017  10:49 AM
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
## 2017-12-17 NOTE — OB Triage Note (Cosign Needed Addendum)
[facility] with c/o high blood pressures and abnormal urine results from earlier today in the office. BP was 152/94. OB team is aware of patients arrival and vitals. Jefferey Picaaylor Ladell Bey, LPN

## 2017-12-17 NOTE — ED Triage Notes (Addendum)
Pt 6 weeks postpartum, complaints of HTN and painful urination. Endorses headaches as well. EKG in triage.        Triage Note   Courtney SickleKatelyn M Ah Bott, RN

## 2017-12-18 LAB — COMPREHENSIVE METABOLIC PANEL
ALT: 16 U/L (ref 0–35)
AST: 18 U/L (ref 0–35)
Albumin: 3.7 g/dL (ref 3.5–5.2)
Alk Phos: 43 U/L (ref 35–105)
Anion Gap: 11 (ref 7–16)
Bilirubin,Total: <0.2 mg/dL (ref 0.0–1.2)
CO2: 26 mmol/L (ref 20–28)
Calcium: 8.6 mg/dL — ABNORMAL LOW (ref 8.8–10.2)
Chloride: 105 mmol/L (ref 96–108)
Creatinine: 0.59 mg/dL (ref 0.51–0.95)
GFR,Black: 133 *
GFR,Caucasian: 115 *
Glucose: 94 mg/dL (ref 60–99)
Lab: 18 mg/dL (ref 6–20)
Potassium: 3.6 mmol/L (ref 3.3–5.1)
Sodium: 142 mmol/L (ref 133–145)
Total Protein: 6.9 g/dL (ref 6.3–7.7)

## 2017-12-18 MED ORDER — HYDROCHLOROTHIAZIDE 12.5 MG PO CAPS *I*
12.5000 mg | ORAL_CAPSULE | Freq: Every morning | ORAL | 0 refills | Status: AC
Start: 2017-12-18 — End: 2018-01-17

## 2017-12-18 NOTE — OB Triage Note (Cosign Needed)
D/c instructions reviewed and signed by patient and is without questions. Pt leaves unit ambulatory at this time. Jefferey Picaaylor Akilah Cureton, LPN

## 2017-12-18 NOTE — Discharge Instructions (Signed)
[facility]   OB Triage Discharge Instructions    You were seen in triage for: elevated blood pressures after delivery    Diagnosis:   Preeclampsia without severe features   Asthma exacerbation     You are being discharged to: Home  The following tests are still pending: None    You may resume your normal activity and diet. Please pick up your new blood pressure medication (hydrochlorothiazide) from the pharmacy and take 1 pill (12.5 mg) a day.     You should follow up with your primary care doctor next week to check your blood pressure and asthma symptoms. They may want to change your blood pressure medication or increase it.       Call your primary obstetric provider or return to triage if you experience the following:  1. Worsening of your symptoms  2. Heavy vaginal bleeding  3. Severe headache, chest pain, difficulty breathing  4. Severe abdominal pain, especially if on the right side    If you cannot reach your obstetric provider, call 911 if it is an emergency.

## 2017-12-22 ENCOUNTER — Ambulatory Visit
Admission: RE | Admit: 2017-12-22 | Discharge: 2017-12-22 | Disposition: A | Discharge status: Home / Self Care | Payer: Medicaid Other | Source: Ambulatory Visit | Attending: Family Medicine | Admitting: Family Medicine

## 2017-12-22 DIAGNOSIS — K439 Ventral hernia without obstruction or gangrene: Secondary | ICD-10-CM | POA: Insufficient documentation

## 2017-12-22 DIAGNOSIS — M7989 Other specified soft tissue disorders: Secondary | ICD-10-CM

## 2017-12-24 LAB — EKG 12-LEAD
P: 59 deg
PR: 187 ms
QRS: 34 deg
QRSD: 105 ms
QT: 478 ms
QTc: 509 ms
Rate: 68 {beats}/min
T: 39 deg

## 2017-12-25 LAB — GYN CYTOLOGY

## 2018-12-03 ENCOUNTER — Other Ambulatory Visit
Admission: RE | Admit: 2018-12-03 | Discharge: 2018-12-03 | Disposition: A | Discharge status: Home / Self Care | Payer: Medicaid Other | Source: Ambulatory Visit | Attending: Family Medicine | Admitting: Family Medicine

## 2018-12-03 DIAGNOSIS — M545 Low back pain: Secondary | ICD-10-CM | POA: Insufficient documentation

## 2018-12-04 LAB — URINALYSIS WITH REFLEX TO MICROSCOPIC
Blood,UA: NEGATIVE
Glucose,UA: NEGATIVE mg/dL
Ketones, UA: NEGATIVE
Leuk Esterase,UA: NEGATIVE
Nitrite,UA: NEGATIVE
Specific Gravity,UA: 1.016 (ref 1.002–1.030)
pH,UA: 7 (ref 5.0–8.0)

## 2018-12-04 LAB — URINE MICROSCOPIC (IQ200)
Bacteria,UA: NONE SEEN
Hyaline Casts,UA: NONE SEEN /lpf (ref 0–5)
RBC,UA: NONE SEEN /hpf (ref 0–3)

## 2018-12-05 LAB — AEROBIC CULTURE: Aerobic Culture: 0

## 2018-12-15 ENCOUNTER — Other Ambulatory Visit
Admission: RE | Admit: 2018-12-15 | Discharge: 2018-12-15 | Disposition: A | Discharge status: Home / Self Care | Payer: Medicaid Other | Source: Ambulatory Visit | Attending: Family | Admitting: Family

## 2018-12-15 DIAGNOSIS — R399 Unspecified symptoms and signs involving the genitourinary system: Secondary | ICD-10-CM | POA: Insufficient documentation

## 2018-12-17 LAB — AEROBIC CULTURE: Aerobic Culture: 0

## 2019-10-08 ENCOUNTER — Other Ambulatory Visit: Payer: Self-pay | Admitting: Family

## 2019-10-08 ENCOUNTER — Ambulatory Visit
Admission: RE | Admit: 2019-10-08 | Discharge: 2019-10-08 | Disposition: A | Discharge status: Home / Self Care | Payer: Medicaid Other | Source: Ambulatory Visit | Attending: Family | Admitting: Family

## 2019-10-08 DIAGNOSIS — R2231 Localized swelling, mass and lump, right upper limb: Secondary | ICD-10-CM

## 2019-11-18 ENCOUNTER — Other Ambulatory Visit
Admission: RE | Admit: 2019-11-18 | Discharge: 2019-11-18 | Disposition: A | Discharge status: Home / Self Care | Payer: Medicaid Other | Source: Ambulatory Visit | Attending: Family Medicine | Admitting: Family Medicine

## 2019-11-18 DIAGNOSIS — I10 Essential (primary) hypertension: Secondary | ICD-10-CM | POA: Insufficient documentation

## 2019-11-18 LAB — BASIC METABOLIC PANEL
Anion Gap: 11 (ref 7–16)
CO2: 26 mmol/L (ref 20–28)
Calcium: 8.7 mg/dL — ABNORMAL LOW (ref 8.8–10.2)
Chloride: 104 mmol/L (ref 96–108)
Creatinine: 0.65 mg/dL (ref 0.51–0.95)
GFR,Black: 127 *
GFR,Caucasian: 110 *
Glucose: 91 mg/dL (ref 60–99)
Lab: 8 mg/dL (ref 6–20)
Potassium: 3.4 mmol/L (ref 3.3–5.1)
Sodium: 141 mmol/L (ref 133–145)

## 2019-11-18 LAB — LIPID PANEL
Chol/HDL Ratio: 4.6
Cholesterol: 148 mg/dL
HDL: 32 mg/dL — ABNORMAL LOW (ref 40–60)
LDL Calculated: 83 mg/dL
Non HDL Cholesterol: 116 mg/dL
Triglycerides: 167 mg/dL — AB

## 2019-11-19 LAB — HEMOGLOBIN A1C: Hemoglobin A1C: 6.1 % — ABNORMAL HIGH

## 2020-12-22 ENCOUNTER — Encounter: Payer: Self-pay | Admitting: Student in an Organized Health Care Education/Training Program

## 2020-12-22 ENCOUNTER — Other Ambulatory Visit: Payer: Self-pay

## 2020-12-22 ENCOUNTER — Emergency Department: Payer: Medicaid Other | Admitting: Radiology

## 2020-12-22 ENCOUNTER — Emergency Department: Payer: Medicaid Other

## 2020-12-22 ENCOUNTER — Emergency Department
Admission: EM | Admit: 2020-12-22 | Discharge: 2020-12-22 | Disposition: A | Discharge status: Home / Self Care | Payer: Medicaid Other | Source: Ambulatory Visit | Attending: Emergency Medicine | Admitting: Emergency Medicine

## 2020-12-22 DIAGNOSIS — S36420A Contusion of duodenum, initial encounter: Secondary | ICD-10-CM

## 2020-12-22 DIAGNOSIS — Y92488 Other paved roadways as the place of occurrence of the external cause: Secondary | ICD-10-CM | POA: Insufficient documentation

## 2020-12-22 DIAGNOSIS — S2243XA Multiple fractures of ribs, bilateral, initial encounter for closed fracture: Secondary | ICD-10-CM | POA: Insufficient documentation

## 2020-12-22 DIAGNOSIS — Y998 Other external cause status: Secondary | ICD-10-CM | POA: Insufficient documentation

## 2020-12-22 DIAGNOSIS — R0781 Pleurodynia: Secondary | ICD-10-CM

## 2020-12-22 DIAGNOSIS — R9431 Abnormal electrocardiogram [ECG] [EKG]: Secondary | ICD-10-CM

## 2020-12-22 DIAGNOSIS — M549 Dorsalgia, unspecified: Secondary | ICD-10-CM

## 2020-12-22 DIAGNOSIS — Y9389 Activity, other specified: Secondary | ICD-10-CM | POA: Insufficient documentation

## 2020-12-22 DIAGNOSIS — I517 Cardiomegaly: Secondary | ICD-10-CM

## 2020-12-22 DIAGNOSIS — Z789 Other specified health status: Secondary | ICD-10-CM

## 2020-12-22 LAB — BASIC METABOLIC PANEL
Anion Gap: 8 (ref 7–16)
CO2: 31 mmol/L — ABNORMAL HIGH (ref 20–28)
Calcium: 8.8 mg/dL (ref 8.8–10.2)
Chloride: 100 mmol/L (ref 96–108)
Creatinine: 0.62 mg/dL (ref 0.51–0.95)
Glucose: 152 mg/dL — ABNORMAL HIGH (ref 60–99)
Lab: 11 mg/dL (ref 6–20)
Potassium: 3.3 mmol/L (ref 3.3–5.1)
Sodium: 139 mmol/L (ref 133–145)
eGFR BY CREAT: 113 *

## 2020-12-22 LAB — CBC AND DIFFERENTIAL
Baso # K/uL: 0 10*3/uL (ref 0.0–0.1)
Basophil %: 0.6 %
Eos # K/uL: 0.2 10*3/uL (ref 0.0–0.4)
Eosinophil %: 3.6 %
Hematocrit: 34 % (ref 34–45)
Hemoglobin: 10.8 g/dL — ABNORMAL LOW (ref 11.2–15.7)
IMM Granulocytes #: 0 10*3/uL (ref 0.0–0.0)
IMM Granulocytes: 0.4 %
Lymph # K/uL: 1.6 10*3/uL (ref 1.2–3.7)
Lymphocyte %: 33.7 %
MCH: 28 pg (ref 26–32)
MCHC: 32 g/dL (ref 32–36)
MCV: 88 fL (ref 79–95)
Mono # K/uL: 0.3 10*3/uL (ref 0.2–0.9)
Monocyte %: 6.4 %
Neut # K/uL: 2.6 10*3/uL (ref 1.6–6.1)
Nucl RBC # K/uL: 0 10*3/uL (ref 0.0–0.0)
Nucl RBC %: 0 /100 WBC (ref 0.0–0.2)
Platelets: 334 10*3/uL (ref 160–370)
RBC: 3.9 MIL/uL (ref 3.9–5.2)
RDW: 12.2 % (ref 11.7–14.4)
Seg Neut %: 55.3 %
WBC: 4.7 10*3/uL (ref 4.0–10.0)

## 2020-12-22 LAB — TROPONIN T 0 HR HIGH SENSITIVITY (IP/ED ONLY): TROP T 0 HR High Sensitivity: <6 ng/L (ref 0–11)

## 2020-12-22 LAB — HOLD GREEN WITH GEL

## 2020-12-22 MED ORDER — ACETAMINOPHEN 500 MG PO TABS *I*
1000.0000 mg | ORAL_TABLET | Freq: Once | ORAL | Status: AC
Start: 2020-12-22 — End: 2020-12-22
  Administered 2020-12-22: 1000 mg via ORAL
  Filled 2020-12-22: qty 2

## 2020-12-22 MED ORDER — MORPHINE SULFATE 4 MG/ML IV SOLN *WRAPPED*
4.0000 mg | Freq: Once | INTRAVENOUS | Status: DC
Start: 2020-12-22 — End: 2020-12-22

## 2020-12-22 MED ORDER — IBUPROFEN 600 MG PO TABS *I*
600.0000 mg | ORAL_TABLET | Freq: Once | ORAL | Status: AC
Start: 2020-12-22 — End: 2020-12-22
  Administered 2020-12-22: 600 mg via ORAL
  Filled 2020-12-22: qty 1

## 2020-12-22 MED ORDER — OXYCODONE HCL 5 MG PO TABS *I*
5.0000 mg | ORAL_TABLET | Freq: Once | ORAL | Status: AC
Start: 2020-12-22 — End: 2020-12-22
  Administered 2020-12-22: 5 mg via ORAL
  Filled 2020-12-22: qty 1
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
## 2020-12-22 NOTE — ED Notes (Addendum)
ED NURSE RESIDENT ATTESTATION       I Courtney Lambert have reviewed the following charting information by the Nurse Resident:  Courtney Lambert., RN   Nursing Assessments  Medications  Plan of Care  Teaching   Notes    In the chart of Courtney Lambert (40 y.o. female) and attest to the charting being accurate.

## 2020-12-25 LAB — EKG 12-LEAD
P: 26 deg
PR: 181 ms
QRS: 26 deg
QRSD: 82 ms
QT: 376 ms
QTc: 429 ms
Rate: 78 {beats}/min
T: 34 deg
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
## 2020-12-29 ENCOUNTER — Ambulatory Visit: Payer: Medicaid Other | Admitting: Acute Care

## 2020-12-29 ENCOUNTER — Other Ambulatory Visit: Payer: Self-pay

## 2020-12-29 VITALS — BP 175/80 | HR 77 | Temp 97.2°F | Resp 16 | Ht 65.0 in | Wt 222.7 lb

## 2020-12-29 DIAGNOSIS — S2249XA Multiple fractures of ribs, unspecified side, initial encounter for closed fracture: Secondary | ICD-10-CM

## 2020-12-29 DIAGNOSIS — Z041 Encounter for examination and observation following transport accident: Secondary | ICD-10-CM

## 8052-04-21 DEATH — deceased
